# Patient Record
Sex: Female | Born: 1955 | Race: Black or African American | Hispanic: No | Marital: Married | Smoking: Former smoker
Health system: Southern US, Community
[De-identification: ages and names within clinical notes are randomized; demographics above are authoritative.]

## PROBLEM LIST (undated history)

## (undated) DIAGNOSIS — F32A Depression, unspecified: Secondary | ICD-10-CM

## (undated) DIAGNOSIS — F329 Major depressive disorder, single episode, unspecified: Secondary | ICD-10-CM

## (undated) DIAGNOSIS — F99 Mental disorder, not otherwise specified: Secondary | ICD-10-CM

## (undated) HISTORY — PX: ABDOMINAL HYSTERECTOMY: SHX81

## (undated) HISTORY — DX: Mental disorder, not otherwise specified: F99

---

## 2001-12-12 ENCOUNTER — Emergency Department (HOSPITAL_COMMUNITY): Admission: EM | Admit: 2001-12-12 | Discharge: 2001-12-12 | Payer: Self-pay | Admitting: Emergency Medicine

## 2004-01-24 ENCOUNTER — Ambulatory Visit (HOSPITAL_COMMUNITY): Admission: RE | Admit: 2004-01-24 | Discharge: 2004-01-24 | Payer: Self-pay | Admitting: Obstetrics & Gynecology

## 2007-02-10 ENCOUNTER — Ambulatory Visit (HOSPITAL_COMMUNITY): Admission: RE | Admit: 2007-02-10 | Discharge: 2007-02-10 | Payer: Self-pay | Admitting: Obstetrics and Gynecology

## 2008-02-14 ENCOUNTER — Ambulatory Visit (HOSPITAL_COMMUNITY): Admission: RE | Admit: 2008-02-14 | Discharge: 2008-02-14 | Payer: Self-pay | Admitting: Obstetrics and Gynecology

## 2008-07-05 ENCOUNTER — Ambulatory Visit (HOSPITAL_COMMUNITY): Admission: RE | Admit: 2008-07-05 | Discharge: 2008-07-05 | Payer: Self-pay | Admitting: Obstetrics and Gynecology

## 2008-07-17 ENCOUNTER — Other Ambulatory Visit: Admission: RE | Admit: 2008-07-17 | Discharge: 2008-07-17 | Payer: Self-pay | Admitting: Obstetrics and Gynecology

## 2008-08-28 ENCOUNTER — Encounter: Payer: Self-pay | Admitting: Obstetrics and Gynecology

## 2008-08-28 ENCOUNTER — Inpatient Hospital Stay (HOSPITAL_COMMUNITY): Admission: RE | Admit: 2008-08-28 | Discharge: 2008-08-30 | Payer: Self-pay | Admitting: Obstetrics and Gynecology

## 2010-07-21 LAB — COMPREHENSIVE METABOLIC PANEL
ALT: 13 U/L (ref 0–35)
AST: 16 U/L (ref 0–37)
Albumin: 3.2 g/dL — ABNORMAL LOW (ref 3.5–5.2)
Alkaline Phosphatase: 74 U/L (ref 39–117)
BUN: 7 mg/dL (ref 6–23)
CO2: 30 mEq/L (ref 19–32)
Calcium: 8.8 mg/dL (ref 8.4–10.5)
Chloride: 104 mEq/L (ref 96–112)
Creatinine, Ser: 0.66 mg/dL (ref 0.4–1.2)
GFR calc Af Amer: >60 mL/min (ref >60)
GFR calc non Af Amer: >60 mL/min (ref >60)
Glucose, Bld: 87 mg/dL (ref 70–99)
Potassium: 4.4 mEq/L (ref 3.5–5.1)
Sodium: 139 mEq/L (ref 135–145)
Total Bilirubin: 0.4 mg/dL (ref 0.3–1.2)
Total Protein: 6.1 g/dL (ref 6.0–8.3)

## 2010-07-21 LAB — CBC
HCT: 31.8 % — ABNORMAL LOW (ref 36.0–46.0)
HCT: 33.5 % — ABNORMAL LOW (ref 36.0–46.0)
HCT: 36.1 % (ref 36.0–46.0)
Hemoglobin: 11.1 g/dL — ABNORMAL LOW (ref 12.0–15.0)
Hemoglobin: 11.7 g/dL — ABNORMAL LOW (ref 12.0–15.0)
Hemoglobin: 12.6 g/dL (ref 12.0–15.0)
MCHC: 34.9 g/dL (ref 30.0–36.0)
MCHC: 34.9 g/dL (ref 30.0–36.0)
MCHC: 34.9 g/dL (ref 30.0–36.0)
MCV: 89.8 fL (ref 78.0–100.0)
MCV: 90.1 fL (ref 78.0–100.0)
MCV: 90.1 fL (ref 78.0–100.0)
Platelets: 280 10*3/uL (ref 150–400)
Platelets: 313 10*3/uL (ref 150–400)
Platelets: 358 10*3/uL (ref 150–400)
RBC: 3.55 MIL/uL — ABNORMAL LOW (ref 3.87–5.11)
RBC: 3.72 MIL/uL — ABNORMAL LOW (ref 3.87–5.11)
RBC: 4.01 MIL/uL (ref 3.87–5.11)
RDW: 14 % (ref 11.5–15.5)
RDW: 14.1 % (ref 11.5–15.5)
RDW: 14.1 % (ref 11.5–15.5)
WBC: 10.4 10*3/uL (ref 4.0–10.5)
WBC: 10.9 10*3/uL — ABNORMAL HIGH (ref 4.0–10.5)
WBC: 7.8 10*3/uL (ref 4.0–10.5)

## 2010-07-21 LAB — DIFFERENTIAL
Basophils Absolute: 0 10*3/uL (ref 0.0–0.1)
Basophils Absolute: 0.1 10*3/uL (ref 0.0–0.1)
Basophils Relative: 0 % (ref 0–1)
Basophils Relative: 1 % (ref 0–1)
Eosinophils Absolute: 0.1 10*3/uL (ref 0.0–0.7)
Eosinophils Absolute: 0.1 10*3/uL (ref 0.0–0.7)
Eosinophils Relative: 1 % (ref 0–5)
Eosinophils Relative: 1 % (ref 0–5)
Lymphocytes Relative: 16 % (ref 12–46)
Lymphocytes Relative: 25 % (ref 12–46)
Lymphs Abs: 1.8 10*3/uL (ref 0.7–4.0)
Lymphs Abs: 2.6 10*3/uL (ref 0.7–4.0)
Monocytes Absolute: 0.5 10*3/uL (ref 0.1–1.0)
Monocytes Absolute: 0.7 10*3/uL (ref 0.1–1.0)
Monocytes Relative: 5 % (ref 3–12)
Monocytes Relative: 7 % (ref 3–12)
Neutro Abs: 6.9 10*3/uL (ref 1.7–7.7)
Neutro Abs: 8.5 10*3/uL — ABNORMAL HIGH (ref 1.7–7.7)
Neutrophils Relative %: 67 % (ref 43–77)
Neutrophils Relative %: 78 % — ABNORMAL HIGH (ref 43–77)

## 2010-07-21 LAB — HCG, QUANTITATIVE, PREGNANCY: hCG, Beta Chain, Quant, S: 3 m[IU]/mL (ref <5)

## 2010-07-21 LAB — TYPE AND SCREEN
ABO/RH(D): O POS
Antibody Screen: NEGATIVE

## 2010-08-25 NOTE — Discharge Summary (Signed)
NAMECAROLEEN, STOERMER          ACCOUNT NO.:  [id number]   MEDICAL RECORD NO.:  [id number]          PATIENT TYPE:  INP   LOCATION:  A336                          FACILITY:  [facility]   PHYSICIAN:  Tilda Burrow, M.D. DATE OF BIRTH:  Aug 29, 1955   DATE OF ADMISSION:  08/28/2008  DATE OF DISCHARGE:  05/21/2010LH                               DISCHARGE SUMMARY   ADMITTING DIAGNOSIS:  Symptomatic uterine fibroids, 20-week size.   DISCHARGE DIAGNOSIS:  Symptomatic uterine fibroids, 20-week size.   PROCEDURE:  Total abdominal hysterectomy, partial panniculectomy, III  Pelosi incision.   DISCHARGE MEDICATIONS:  1. Motrin 600 mg p.o. q.6 h. p.r.n. pain.  2. Cipro 500 mg p.o. b.i.d. x7 days.  3. Vicodin 5/500 thirty tablets 1-2 q.4 h. p.r.n. severe pain.   Follow up in 4-5 days for Jackson-Pratt drain removal and incision  check.   HOSPITAL SUMMARY:  A 55 year old female admitted for abdominal  hysterectomy with excision of portion of lower abdominal fat and skin  for excess.  Uterine fibroids were 16 x 8 x 13 cm on ultrasound with  multiple fibroids identified with no other suspected pelvic  abnormalities.  Surgery was uneventful with blood loss of less than 100  mL.  Jackson-Pratt drains were left in place at the site of the old  excision of fat and skin.  Postoperatively, the patient did excellently  with postoperative hemoglobin 11, hematocrit 33 compared to 12 and 37  preop.  She had resumption of normal bowel function, toleration of diet  within 24 hours.  She was able to ambulate without difficulty, incision  looked great, and she was discharged home on postop day 2 with minimal  pain med requirements.  Pathology reports showed a 600-g uterus and an  ellipse of skin and fatty tissue.  Routine postsurgical instructions  including daily ambulation, notification of physician for fever or  swelling in the incision area.      Tilda Burrow, M.D.  Electronically Signed     JVF/MEDQ  D:  08/30/2008  T:  08/31/2008  Job:  [id number]   cc:   [facility].   Edward L. Juanetta Gosling, M.D.  Fax: [phone]

## 2010-08-25 NOTE — H&P (Signed)
Betty Maldonado, Betty Maldonado          ACCOUNT NO.:  [id number]   MEDICAL RECORD NO.:  [id number]          PATIENT TYPE:  AMB   LOCATION:  DAY                           FACILITY:  [facility]   PHYSICIAN:  Tilda Burrow, M.D. DATE OF BIRTH:  [birth year]   DATE OF ADMISSION:  DATE OF DISCHARGE:  LH                              HISTORY & PHYSICAL   ADMITTING DIAGNOSIS:  Symptomatic uterine fibroids, 20 weeks' size.   HISTORY OF PRESENT ILLNESS:  This 55 year old female is admitted this  time for abdominal hysterectomy through a Pelosi incision.  She is a  gravida 2, para 1-0-1-1, premenopausal with LMP April 2010 with a recent  Pap smear normal, GC and Chlamydia culture negative, and ultrasound  confirming a large mobile uterus 16 x 8 x 13 cm with multiple fibroids  up to 4.6 cm in size with normal ovaries bilaterally.  No  intraperitoneal fluid or other suspected pelvic abnormalities.  The  patient has been seen in our office for the discomfort associated with  large pelvic mass, which she identified herself.  She denies any  intermenstrual bleeding, postcoital bleeding, or spotting.  The  ultrasound did not identify any endometrial thickening, had 9-mm  thickness, normal for this age in premenopausal individual.  Pap smear  state is normal.  GC and Chlamydia culture negative.   PAST MEDICAL HISTORY:  Benign.  She sees Dr. Juanetta Gosling for general medical  care.   SURGICAL HISTORY:  Negative other than D and C many years ago.   HABITS:  Cigarettes, 8 cigarettes per day.  Alcohol and recreational  drugs denied.   MEDICATIONS:  Iron and multivitamin x1 month.   PHYSICAL EXAMINATION:  GENERAL:  She is a healthy-appearing, African  American female, alert, oriented x3.  HEENT:  Pupils equal, round, and reactive.  Also notable for upper  dentures, which are removable.  NECK:  Supple.  Normal thyroid.  CHEST:  Clear to auscultation.  LUNGS:  Clear.  CARDIAC:  Regular rate and rhythm.  No  murmurs, rubs, or gallops  appreciated.  ABDOMEN:  Nontender without masses other than the previously mentioned  fibroid.  There are no hernias.  External genitalia are normal.  Multiparous female.  GU:  Vaginal exam, good vaginal length.  Cervix is small, less than 3  cm.  Transverse diameter mobile.  Adnexa is without masses.  The uterus  is sufficiently mobile, that pelvic access should be good.  EXTREMITIES:  Without cyanosis, clubbing, or edema.   IMPRESSION:  Uterine fibroids, 20 weeks.   PLAN:  Abdominal hysterectomy by Pelosi incision.  The patient has been  counseled over the procedure, risks, potential complications, and  alternatives including, not proceeding at this time with any surgery,  and she understands her options and desires to proceed at this time.  She has also asked [place] to assess the ovaries and consider removal if  abnormalities are suspected.      Tilda Burrow, M.D.  Electronically Signed     JVF/MEDQ  D:  08/14/2008  T:  08/15/2008  Job:  [id number]   cc:   Ramon Dredge L. Juanetta Gosling,  M.D.  Fax: [phone]   [facility]

## 2010-08-25 NOTE — Op Note (Signed)
NAME:  Betty Maldonado, Betty Maldonado NO.:  [id number]   MEDICAL RECORD NO.:  [id number]          PATIENT TYPE:  INP   LOCATION:  A336                          FACILITY:  [facility]   PHYSICIAN:  Tilda Burrow, M.D. DATE OF BIRTH:  07/02/1955   DATE OF PROCEDURE:  DATE OF DISCHARGE:                               OPERATIVE REPORT   PREOPERATIVE DIAGNOSES:  Symptomatic uterine fibroids, 20-week size.   POSTOPERATIVE DIAGNOSES:  Symptomatic uterine fibroids, 20-week size.   PROCEDURE:  Total abdominal hysterectomy.   SURGEON:  Tilda Burrow, MD   ASSISTANT:  Excell Seltzer. Annabell Howells, M.D.   ANESTHESIA:  General, Maryan Puls, CRNA   COMPLICATIONS:  None   FINDINGS:  Large globular fibroid uterus reaching to umbilicus, easy  access to lower uterine segment.   DETAILS OF PROCEDURE:  The patient was taken to the operating room,  prepped and draped in the usual fashion for lower abdominal surgery.  Previously marked off the ellipse of skin and subcu fatty tissue was  removed from lateral to the anterosuperior iliac crest to the midline,  cut approximately 40 cm long incision by 12 cm maximum width.  The layer  of fatty tissue was left overlying the fascia throughout.  Midline lower  abdominal incision was performed with sharp dissection through the  fascia and careful blunt entry into the peritoneal cavity and sharp  opening of the peritoneum cephalad and caudad.  No suspicion of injury  was encountered.  The omentum was smooth and normal in appearance.  Uterus was irregular, lobular with multiple external, internal, and  intramural fibroids.  Round ligaments were taken down on each side.  Tubes and ovaries were inspected and the ovaries were small 2-3 cm in  length normal in gross appearance.  Tubes and ovaries were preserved as  discussed previously in preoperative counseling.  A 0-chromic was used  to ligate pedicles.  Lahey thyroid tenaculum was then used to grasp the  uterine fundus  and Balfour retractor was in place with 2 laparotomy  tapes and a surgical tape in the abdomen to hold the bowel out of the  surgical field.  The broad ligament was serially clamped, cut, and  suture ligated using 0-chromic.  The uterine vessels were cross-clamped  with curved Heaney clamp, Kelly clamp for back bleeding, transected with  scissors and 0-chromic suture ligature.  Upper cardinal ligaments were  clamped, cut, and suture ligated with 0-chromic and straight Heaney  clamps.  The uterine fundus was amputated out of the way and lower  uterine segment grasped with a tenaculum and we marched down the upper  and lower cardinal ligaments serially clamping, cutting, and suture  ligating using small bites with the straight Heaney clamp, knife  dissection and 0-chromic suture ligature.  A portion of the paracervical  ring was left in place as we cord out the cervix.  The cervix was  amputated off the cuff using Mayo scissors.  Aldridge stitches were  placed at each line of vaginal angle to attach the cuff angles to the  lower cardinal ligaments.  The remainder of the cuff was closed with  series of interrupted 0-chromic sutures with good cuff support obtained.  Hemostasis was excellent throughout the procedure.  Bladder flap was  inspected to confirm it is hemostatic.  Single stitch was placed over  the bladder flap over the cuff itself.  The pelvis was irrigated,  inspected, and confirmed as adequately hemostatic.  EBL was 100 mL or  less.   Laparotomy equipment was removed, peritoneum closed with running 2-0  chromic after which the omentum moved down beneath the peritoneal  closure.  The fascia was closed in a running, locking fashion with 0-  Prolene suture.  Subcu tissues were re-approximated with interrupted 2-0  plain with 2 flat 10 mm Jackson-Pratt drains placed from each lateral  aspect of the incision and allowed to exit through separate stab  incision just over the mons  pubis.  These were sutured in place.  Subcuticular closure of the incision was performed using subcuticular 3-  0 Vicryl.  It should be noted that the lateral one-fourth of the  incision on each side was closed earlier in the case prior to the actual  peritoneal opening.  EBL 100 ml.  Sponge and needle counts were correct.  The patient went to recovery room in good condition.      Tilda Burrow, M.D.  Electronically Signed     JVF/MEDQ  D:  08/28/2008  T:  08/29/2008  Job:  [id number]   cc:   Ramon Dredge L. Juanetta Gosling, M.D.  Fax: [phone]   [facility]

## 2015-12-18 ENCOUNTER — Other Ambulatory Visit (HOSPITAL_COMMUNITY): Payer: Self-pay | Admitting: Otolaryngology

## 2016-01-30 ENCOUNTER — Other Ambulatory Visit (HOSPITAL_COMMUNITY): Payer: Self-pay | Admitting: Otolaryngology

## 2016-01-30 ENCOUNTER — Ambulatory Visit (HOSPITAL_COMMUNITY)
Admission: RE | Admit: 2016-01-30 | Discharge: 2016-01-30 | Disposition: A | Discharge status: Home / Self Care | Payer: 59 | Source: Ambulatory Visit | Attending: Otolaryngology | Admitting: Otolaryngology

## 2016-01-30 DIAGNOSIS — R7611 Nonspecific reaction to tuberculin skin test without active tuberculosis: Secondary | ICD-10-CM | POA: Insufficient documentation

## 2017-07-05 ENCOUNTER — Encounter: Payer: Self-pay | Admitting: Family Medicine

## 2017-07-05 ENCOUNTER — Ambulatory Visit (INDEPENDENT_AMBULATORY_CARE_PROVIDER_SITE_OTHER): Payer: BLUE CROSS/BLUE SHIELD | Admitting: Family Medicine

## 2017-07-05 VITALS — BP 144/92 | Ht 65.0 in | Wt 232.0 lb

## 2017-07-05 DIAGNOSIS — M542 Cervicalgia: Secondary | ICD-10-CM | POA: Diagnosis not present

## 2017-07-05 DIAGNOSIS — M25519 Pain in unspecified shoulder: Secondary | ICD-10-CM | POA: Diagnosis not present

## 2017-07-05 MED ORDER — DICLOFENAC SODIUM 50 MG PO TBEC: 50.0000 mg | DELAYED_RELEASE_TABLET | Freq: Two times a day (BID) | ORAL | 0 refills | Status: DC

## 2017-07-05 MED ORDER — TIZANIDINE HCL 4 MG PO TABS: 4.0000 mg | ORAL_TABLET | Freq: Two times a day (BID) | ORAL | 0 refills | Status: DC | PRN
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
## 2017-07-07 ENCOUNTER — Other Ambulatory Visit: Payer: Self-pay

## 2017-07-07 ENCOUNTER — Ambulatory Visit (HOSPITAL_COMMUNITY): Payer: BLUE CROSS/BLUE SHIELD | Attending: Family Medicine | Admitting: Physical Therapy

## 2017-07-07 ENCOUNTER — Encounter (HOSPITAL_COMMUNITY): Payer: Self-pay | Admitting: Physical Therapy

## 2017-07-07 DIAGNOSIS — R29898 Other symptoms and signs involving the musculoskeletal system: Secondary | ICD-10-CM

## 2017-07-07 DIAGNOSIS — M25512 Pain in left shoulder: Secondary | ICD-10-CM | POA: Diagnosis not present

## 2017-07-07 DIAGNOSIS — M542 Cervicalgia: Secondary | ICD-10-CM | POA: Diagnosis not present

## 2017-07-07 DIAGNOSIS — M6281 Muscle weakness (generalized): Secondary | ICD-10-CM | POA: Insufficient documentation
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
## 2017-07-08 ENCOUNTER — Encounter (HOSPITAL_COMMUNITY): Payer: Self-pay | Admitting: Physical Therapy

## 2017-07-08 ENCOUNTER — Ambulatory Visit (HOSPITAL_COMMUNITY): Payer: BLUE CROSS/BLUE SHIELD | Admitting: Physical Therapy

## 2017-07-08 DIAGNOSIS — M542 Cervicalgia: Secondary | ICD-10-CM | POA: Diagnosis not present

## 2017-07-08 DIAGNOSIS — R29898 Other symptoms and signs involving the musculoskeletal system: Secondary | ICD-10-CM

## 2017-07-08 DIAGNOSIS — M6281 Muscle weakness (generalized): Secondary | ICD-10-CM

## 2017-07-08 DIAGNOSIS — M25512 Pain in left shoulder: Secondary | ICD-10-CM
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
## 2017-07-08 NOTE — Patient Instructions (Signed)
  RETRACTION / CHIN TUCK Slowly draw your head back so that your ears line up with your shoulders.  Repeat 10 Times Hold 5 Seconds Complete 1 Set Perform 1 Time(s) a Day    CERVICAL ROTATION Turn your head towards the side, then return back to looking straight ahead. 1 x 10 each side  Repeat 10 Times Hold 2 Seconds Complete 1 Set Perform 1 Time(s) a Day    CERVICAL FLEXION Tilt your head downwards, then return back to looking straight ahead. 1 x 10  Repeat 10 Times Hold 2 Seconds Complete 1 Set Perform 1 Time(s) a Day    CERVICAL EXTENSION Tilt your head upwards, then return back to looking straight ahead. 1 x 10  Repeat 1 Time Hold 2 Seconds Complete 1 Set Perform 1 Time(s) a Day    CERVICAL SIDE BEND Tilt your head towards the side, then return back to looking straight ahead. (Be sure to keep you eyes and nose pointed straight ahead the entire time) 1 x 10 to the left and the right  Repeat 1 Time Hold 2 Seconds Complete 1 Set Perform 1 Time(s) a Day

## 2017-07-11 ENCOUNTER — Ambulatory Visit (HOSPITAL_COMMUNITY): Payer: BLUE CROSS/BLUE SHIELD | Attending: Family Medicine | Admitting: Physical Therapy

## 2017-07-11 ENCOUNTER — Encounter (HOSPITAL_COMMUNITY): Payer: Self-pay | Admitting: Physical Therapy

## 2017-07-11 DIAGNOSIS — M6281 Muscle weakness (generalized): Secondary | ICD-10-CM | POA: Insufficient documentation

## 2017-07-11 DIAGNOSIS — M25512 Pain in left shoulder: Secondary | ICD-10-CM | POA: Diagnosis not present

## 2017-07-11 DIAGNOSIS — R29898 Other symptoms and signs involving the musculoskeletal system: Secondary | ICD-10-CM | POA: Insufficient documentation

## 2017-07-11 DIAGNOSIS — M542 Cervicalgia: Secondary | ICD-10-CM | POA: Diagnosis not present

## 2017-07-11 NOTE — Patient Instructions (Addendum)
Strengthening: Lateral Bend - Isometric (in Neutral)    Using light pressure from fingertips, press into right temple. Resist bending head sideways. Hold _5___ seconds. Repeat __5-10__ times per set. Do _1___ sets per session. Do ___2_ sessions per day.  http://orth.exer.us/302   Copyright  VHI. All rights reserved.  Strengthening: Extension - Isometric (in Neutral)    Using light pressure from fingertips at back of head, resist bending head backward. Hold ___5_ seconds. Repeat _5-10___ times per set. Do _1___ sets per session. Do __3__ sessions per day.  http://orth.exer.us/308   Copyright  VHI. All rights reserved.  Strengthening: Shoulder Shrug (Phase 1)    Shrug shoulders backward now relax . Repeat _10___ times per set. Do __1__ sets per session. Do ___2_ sessions per day.  http://orth.exer.us/336   Copyright  VHI. All rights reserved.
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
## 2017-07-13 ENCOUNTER — Ambulatory Visit (HOSPITAL_COMMUNITY): Payer: BLUE CROSS/BLUE SHIELD | Admitting: Physical Therapy

## 2017-07-13 ENCOUNTER — Encounter (HOSPITAL_COMMUNITY): Payer: Self-pay | Admitting: Physical Therapy

## 2017-07-13 DIAGNOSIS — R29898 Other symptoms and signs involving the musculoskeletal system: Secondary | ICD-10-CM | POA: Diagnosis not present

## 2017-07-13 DIAGNOSIS — M25512 Pain in left shoulder: Secondary | ICD-10-CM

## 2017-07-13 DIAGNOSIS — M542 Cervicalgia: Secondary | ICD-10-CM | POA: Diagnosis not present

## 2017-07-13 DIAGNOSIS — M6281 Muscle weakness (generalized): Secondary | ICD-10-CM | POA: Diagnosis not present
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
## 2017-07-15 ENCOUNTER — Encounter: Payer: Self-pay | Admitting: Family Medicine

## 2017-07-15 ENCOUNTER — Ambulatory Visit (INDEPENDENT_AMBULATORY_CARE_PROVIDER_SITE_OTHER): Payer: BLUE CROSS/BLUE SHIELD | Admitting: Family Medicine

## 2017-07-15 VITALS — BP 142/88 | Ht 65.0 in | Wt 231.0 lb

## 2017-07-15 DIAGNOSIS — F4321 Adjustment disorder with depressed mood: Secondary | ICD-10-CM | POA: Insufficient documentation

## 2017-07-15 DIAGNOSIS — Z634 Disappearance and death of family member: Secondary | ICD-10-CM

## 2017-07-15 DIAGNOSIS — F321 Major depressive disorder, single episode, moderate: Secondary | ICD-10-CM

## 2017-07-15 MED ORDER — SERTRALINE HCL 50 MG PO TABS: 50.0000 mg | ORAL_TABLET | Freq: Every day | ORAL | 3 refills | Status: DC
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
## 2017-07-18 NOTE — Addendum Note (Signed)
Addended by: Carmelina Noun on: 07/18/2017 06:15 PM   Modules accepted: Orders

## 2017-07-19 ENCOUNTER — Other Ambulatory Visit: Payer: Self-pay | Admitting: Family Medicine

## 2017-07-19 ENCOUNTER — Ambulatory Visit (HOSPITAL_COMMUNITY): Payer: BLUE CROSS/BLUE SHIELD | Admitting: Physical Therapy

## 2017-07-19 ENCOUNTER — Encounter (HOSPITAL_COMMUNITY): Payer: Self-pay | Admitting: Physical Therapy

## 2017-07-19 DIAGNOSIS — M25512 Pain in left shoulder: Secondary | ICD-10-CM

## 2017-07-19 DIAGNOSIS — M542 Cervicalgia: Secondary | ICD-10-CM

## 2017-07-19 DIAGNOSIS — M6281 Muscle weakness (generalized): Secondary | ICD-10-CM | POA: Diagnosis not present

## 2017-07-19 DIAGNOSIS — Z634 Disappearance and death of family member: Principal | ICD-10-CM

## 2017-07-19 DIAGNOSIS — F321 Major depressive disorder, single episode, moderate: Secondary | ICD-10-CM

## 2017-07-19 DIAGNOSIS — R29898 Other symptoms and signs involving the musculoskeletal system: Secondary | ICD-10-CM | POA: Diagnosis not present

## 2017-07-19 DIAGNOSIS — F4321 Adjustment disorder with depressed mood: Secondary | ICD-10-CM
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
## 2017-07-21 ENCOUNTER — Ambulatory Visit (HOSPITAL_COMMUNITY): Payer: BLUE CROSS/BLUE SHIELD | Admitting: Physical Therapy

## 2017-07-21 DIAGNOSIS — M6281 Muscle weakness (generalized): Secondary | ICD-10-CM | POA: Diagnosis not present

## 2017-07-21 DIAGNOSIS — M25512 Pain in left shoulder: Secondary | ICD-10-CM

## 2017-07-21 DIAGNOSIS — M542 Cervicalgia: Secondary | ICD-10-CM

## 2017-07-21 DIAGNOSIS — R29898 Other symptoms and signs involving the musculoskeletal system: Secondary | ICD-10-CM

## 2017-07-21 NOTE — Patient Instructions (Addendum)
Scapular Retraction (Prone)    Lie with arms at sides. Pinch shoulder blades together and raise arms a few inches from floor. Repeat _15___ times per set. Do _4___ sets per session. Do 2____ sessions per day.  http://orth.exer.us/954   Copyright  VHI. All rights reserved.  Scapular Retraction: Abduction (Prone)    Lie with upper arms straight out from sides, elbows bent to 90. Pinch shoulder blades together and raise arms a few inches from floor. Repeat __10__ times per set. Do __1__ sets per session. Do __2__ sessions per day.  http://orth.exer.us/956   Copyright  VHI. All rights reserved.  Strengthening: Wall Push-Up    With arms slightly wider apart than shoulder width, and feet _15___ inches from wall, gently lean body toward wall. Repeat _10-15___ times per set. Do __1__ sets per session. Do _2___ sessions per day.  http://orth.exer.us/904   Copyright  VHI. All rights reserved.
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
## 2017-07-26 ENCOUNTER — Ambulatory Visit (HOSPITAL_COMMUNITY): Payer: BLUE CROSS/BLUE SHIELD | Admitting: Physical Therapy

## 2017-07-26 ENCOUNTER — Encounter (HOSPITAL_COMMUNITY): Payer: Self-pay | Admitting: Physical Therapy

## 2017-07-26 DIAGNOSIS — R29898 Other symptoms and signs involving the musculoskeletal system: Secondary | ICD-10-CM

## 2017-07-26 DIAGNOSIS — M6281 Muscle weakness (generalized): Secondary | ICD-10-CM | POA: Diagnosis not present

## 2017-07-26 DIAGNOSIS — M25512 Pain in left shoulder: Secondary | ICD-10-CM | POA: Diagnosis not present

## 2017-07-26 DIAGNOSIS — M542 Cervicalgia: Secondary | ICD-10-CM | POA: Diagnosis not present
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
## 2017-07-28 ENCOUNTER — Ambulatory Visit (HOSPITAL_COMMUNITY): Payer: BLUE CROSS/BLUE SHIELD | Admitting: Physical Therapy

## 2017-07-28 ENCOUNTER — Encounter (HOSPITAL_COMMUNITY): Payer: Self-pay | Admitting: Physical Therapy

## 2017-07-28 ENCOUNTER — Encounter: Payer: Self-pay | Admitting: Family Medicine

## 2017-07-28 DIAGNOSIS — R29898 Other symptoms and signs involving the musculoskeletal system: Secondary | ICD-10-CM | POA: Diagnosis not present

## 2017-07-28 DIAGNOSIS — M6281 Muscle weakness (generalized): Secondary | ICD-10-CM

## 2017-07-28 DIAGNOSIS — M25512 Pain in left shoulder: Secondary | ICD-10-CM

## 2017-07-28 DIAGNOSIS — M542 Cervicalgia: Secondary | ICD-10-CM
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
## 2017-08-01 ENCOUNTER — Other Ambulatory Visit: Payer: Self-pay | Admitting: Family Medicine

## 2017-08-02 ENCOUNTER — Encounter (HOSPITAL_COMMUNITY): Payer: Self-pay | Admitting: Physical Therapy

## 2017-08-02 ENCOUNTER — Ambulatory Visit (HOSPITAL_COMMUNITY): Payer: BLUE CROSS/BLUE SHIELD | Admitting: Physical Therapy

## 2017-08-02 DIAGNOSIS — M6281 Muscle weakness (generalized): Secondary | ICD-10-CM | POA: Diagnosis not present

## 2017-08-02 DIAGNOSIS — M542 Cervicalgia: Secondary | ICD-10-CM | POA: Diagnosis not present

## 2017-08-02 DIAGNOSIS — M25512 Pain in left shoulder: Secondary | ICD-10-CM

## 2017-08-02 DIAGNOSIS — R29898 Other symptoms and signs involving the musculoskeletal system: Secondary | ICD-10-CM | POA: Diagnosis not present
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
## 2017-08-04 ENCOUNTER — Ambulatory Visit (HOSPITAL_COMMUNITY): Payer: BLUE CROSS/BLUE SHIELD | Admitting: Physical Therapy

## 2017-08-04 ENCOUNTER — Encounter (HOSPITAL_COMMUNITY): Payer: Self-pay | Admitting: Physical Therapy

## 2017-08-04 DIAGNOSIS — M25512 Pain in left shoulder: Secondary | ICD-10-CM | POA: Diagnosis not present

## 2017-08-04 DIAGNOSIS — M542 Cervicalgia: Secondary | ICD-10-CM | POA: Diagnosis not present

## 2017-08-04 DIAGNOSIS — M6281 Muscle weakness (generalized): Secondary | ICD-10-CM

## 2017-08-04 DIAGNOSIS — R29898 Other symptoms and signs involving the musculoskeletal system: Secondary | ICD-10-CM

## 2017-08-04 NOTE — Patient Instructions (Signed)
  WALL PUSH UPS Standing at a wall, place your arms out in front of you with your elbows [place] so that your hands just reach the wall. Next, bend your elbows slowly to bring your chest closer to the wall. Maintain your feet planted on the ground the entire time. Repeat 10 Times Hold 1 Second Complete 1 Set Perform 1 Time(s) a Day
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
## 2017-08-09 ENCOUNTER — Ambulatory Visit (HOSPITAL_COMMUNITY): Payer: BLUE CROSS/BLUE SHIELD | Admitting: Physical Therapy

## 2017-08-09 ENCOUNTER — Encounter (HOSPITAL_COMMUNITY): Payer: Self-pay | Admitting: Physical Therapy

## 2017-08-09 DIAGNOSIS — M25512 Pain in left shoulder: Secondary | ICD-10-CM

## 2017-08-09 DIAGNOSIS — M542 Cervicalgia: Secondary | ICD-10-CM | POA: Diagnosis not present

## 2017-08-09 DIAGNOSIS — R29898 Other symptoms and signs involving the musculoskeletal system: Secondary | ICD-10-CM | POA: Diagnosis not present

## 2017-08-09 DIAGNOSIS — M6281 Muscle weakness (generalized): Secondary | ICD-10-CM | POA: Diagnosis not present
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
## 2017-08-11 ENCOUNTER — Encounter (HOSPITAL_COMMUNITY): Payer: Self-pay | Admitting: Physical Therapy

## 2017-08-11 ENCOUNTER — Ambulatory Visit (HOSPITAL_COMMUNITY): Payer: BLUE CROSS/BLUE SHIELD | Attending: Family Medicine | Admitting: Physical Therapy

## 2017-08-11 DIAGNOSIS — M25512 Pain in left shoulder: Secondary | ICD-10-CM

## 2017-08-11 DIAGNOSIS — M542 Cervicalgia: Secondary | ICD-10-CM | POA: Insufficient documentation

## 2017-08-11 DIAGNOSIS — M6281 Muscle weakness (generalized): Secondary | ICD-10-CM | POA: Diagnosis not present

## 2017-08-11 DIAGNOSIS — R29898 Other symptoms and signs involving the musculoskeletal system: Secondary | ICD-10-CM | POA: Diagnosis not present
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
## 2017-08-16 ENCOUNTER — Encounter (HOSPITAL_COMMUNITY): Payer: Self-pay

## 2017-08-16 ENCOUNTER — Ambulatory Visit (HOSPITAL_COMMUNITY): Payer: BLUE CROSS/BLUE SHIELD

## 2017-08-16 DIAGNOSIS — M542 Cervicalgia: Secondary | ICD-10-CM

## 2017-08-16 DIAGNOSIS — M25512 Pain in left shoulder: Secondary | ICD-10-CM | POA: Diagnosis not present

## 2017-08-16 DIAGNOSIS — R29898 Other symptoms and signs involving the musculoskeletal system: Secondary | ICD-10-CM | POA: Diagnosis not present

## 2017-08-16 DIAGNOSIS — M6281 Muscle weakness (generalized): Secondary | ICD-10-CM | POA: Diagnosis not present

## 2017-08-16 NOTE — Patient Instructions (Signed)
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
## 2017-08-17 ENCOUNTER — Ambulatory Visit (INDEPENDENT_AMBULATORY_CARE_PROVIDER_SITE_OTHER): Payer: BLUE CROSS/BLUE SHIELD | Admitting: Family Medicine

## 2017-08-17 ENCOUNTER — Encounter: Payer: Self-pay | Admitting: Family Medicine

## 2017-08-17 VITALS — BP 134/80 | Ht 65.0 in | Wt 224.0 lb

## 2017-08-17 DIAGNOSIS — Z114 Encounter for screening for human immunodeficiency virus [HIV]: Secondary | ICD-10-CM

## 2017-08-17 DIAGNOSIS — Z79899 Other long term (current) drug therapy: Secondary | ICD-10-CM

## 2017-08-17 DIAGNOSIS — Z13220 Encounter for screening for lipoid disorders: Secondary | ICD-10-CM

## 2017-08-17 DIAGNOSIS — Z1231 Encounter for screening mammogram for malignant neoplasm of breast: Secondary | ICD-10-CM | POA: Diagnosis not present

## 2017-08-17 DIAGNOSIS — Z1211 Encounter for screening for malignant neoplasm of colon: Secondary | ICD-10-CM | POA: Diagnosis not present

## 2017-08-17 DIAGNOSIS — Z1239 Encounter for other screening for malignant neoplasm of breast: Secondary | ICD-10-CM

## 2017-08-17 DIAGNOSIS — Z1159 Encounter for screening for other viral diseases: Secondary | ICD-10-CM

## 2017-08-17 MED ORDER — SERTRALINE HCL 100 MG PO TABS: 100.0000 mg | ORAL_TABLET | Freq: Every day | ORAL | 5 refills | Status: DC
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
## 2017-08-18 ENCOUNTER — Ambulatory Visit (HOSPITAL_COMMUNITY): Payer: BLUE CROSS/BLUE SHIELD | Admitting: Physical Therapy

## 2017-08-18 ENCOUNTER — Encounter (HOSPITAL_COMMUNITY): Payer: Self-pay | Admitting: Physical Therapy

## 2017-08-18 DIAGNOSIS — M25512 Pain in left shoulder: Secondary | ICD-10-CM

## 2017-08-18 DIAGNOSIS — M542 Cervicalgia: Secondary | ICD-10-CM

## 2017-08-18 DIAGNOSIS — M6281 Muscle weakness (generalized): Secondary | ICD-10-CM

## 2017-08-18 DIAGNOSIS — R29898 Other symptoms and signs involving the musculoskeletal system: Secondary | ICD-10-CM

## 2017-08-18 NOTE — Patient Instructions (Signed)
  UPPER TRAP STRETCH - HAND ON HEAD Begin by retracting your head back into a chin tuck position. Next, move your head towards one side with the help of your hand for light over pressure. To stretch left side bring your head to the right side Repeat 3 Times Hold 30 Seconds Complete 1 Set Perform 1 Time(s) a Day
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
## 2017-08-23 ENCOUNTER — Encounter (HOSPITAL_COMMUNITY): Payer: Self-pay | Admitting: Physical Therapy

## 2017-08-23 ENCOUNTER — Ambulatory Visit (HOSPITAL_COMMUNITY): Payer: BLUE CROSS/BLUE SHIELD | Admitting: Physical Therapy

## 2017-08-23 DIAGNOSIS — M6281 Muscle weakness (generalized): Secondary | ICD-10-CM

## 2017-08-23 DIAGNOSIS — M25512 Pain in left shoulder: Secondary | ICD-10-CM | POA: Diagnosis not present

## 2017-08-23 DIAGNOSIS — R29898 Other symptoms and signs involving the musculoskeletal system: Secondary | ICD-10-CM | POA: Diagnosis not present

## 2017-08-23 DIAGNOSIS — M542 Cervicalgia: Secondary | ICD-10-CM
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
## 2017-08-24 ENCOUNTER — Ambulatory Visit (HOSPITAL_COMMUNITY)
Admission: RE | Admit: 2017-08-24 | Discharge: 2017-08-24 | Disposition: A | Discharge status: Home / Self Care | Payer: BLUE CROSS/BLUE SHIELD | Source: Ambulatory Visit | Attending: Family Medicine | Admitting: Family Medicine

## 2017-08-24 ENCOUNTER — Other Ambulatory Visit: Payer: Self-pay | Admitting: *Deleted

## 2017-08-24 ENCOUNTER — Encounter (HOSPITAL_COMMUNITY): Payer: Self-pay

## 2017-08-24 DIAGNOSIS — Z1231 Encounter for screening mammogram for malignant neoplasm of breast: Secondary | ICD-10-CM | POA: Insufficient documentation

## 2017-08-25 ENCOUNTER — Encounter (HOSPITAL_COMMUNITY): Payer: Self-pay | Admitting: Physical Therapy

## 2017-08-25 ENCOUNTER — Ambulatory Visit (HOSPITAL_COMMUNITY): Payer: BLUE CROSS/BLUE SHIELD | Admitting: Physical Therapy

## 2017-08-25 DIAGNOSIS — M25512 Pain in left shoulder: Secondary | ICD-10-CM

## 2017-08-25 DIAGNOSIS — M542 Cervicalgia: Secondary | ICD-10-CM

## 2017-08-25 DIAGNOSIS — M6281 Muscle weakness (generalized): Secondary | ICD-10-CM

## 2017-08-25 DIAGNOSIS — R29898 Other symptoms and signs involving the musculoskeletal system: Secondary | ICD-10-CM | POA: Diagnosis not present
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
## 2017-08-26 DIAGNOSIS — Z1159 Encounter for screening for other viral diseases: Secondary | ICD-10-CM | POA: Diagnosis not present

## 2017-08-26 DIAGNOSIS — Z13220 Encounter for screening for lipoid disorders: Secondary | ICD-10-CM | POA: Diagnosis not present

## 2017-08-26 DIAGNOSIS — Z79899 Other long term (current) drug therapy: Secondary | ICD-10-CM | POA: Diagnosis not present

## 2017-08-26 DIAGNOSIS — Z114 Encounter for screening for human immunodeficiency virus [HIV]: Secondary | ICD-10-CM | POA: Diagnosis not present

## 2017-08-27 LAB — LIPID PANEL
Chol/HDL Ratio: 5.2 ratio — ABNORMAL HIGH (ref 0.0–4.4)
Cholesterol, Total: 194 mg/dL (ref 100–199)
HDL: 37 mg/dL — ABNORMAL LOW (ref >39)
LDL Calculated: 134 mg/dL — ABNORMAL HIGH (ref 0–99)
Triglycerides: 113 mg/dL (ref 0–149)
VLDL Cholesterol Cal: 23 mg/dL (ref 5–40)

## 2017-08-27 LAB — BASIC METABOLIC PANEL
BUN/Creatinine Ratio: 9 (ref 9–23)
BUN: 8 mg/dL (ref 6–24)
CO2: 24 mmol/L (ref 20–29)
Calcium: 9.2 mg/dL (ref 8.7–10.2)
Chloride: 105 mmol/L (ref 96–106)
Creatinine, Ser: 0.87 mg/dL (ref 0.57–1.00)
GFR calc Af Amer: 84 mL/min/{1.73_m2} (ref >59)
GFR calc non Af Amer: 73 mL/min/{1.73_m2} (ref >59)
Glucose: 103 mg/dL — ABNORMAL HIGH (ref 65–99)
Potassium: 4.2 mmol/L (ref 3.5–5.2)
Sodium: 142 mmol/L (ref 134–144)

## 2017-08-27 LAB — HEPATITIS C ANTIBODY: Hep C Virus Ab: <0.1 s/co ratio (ref 0.0–0.9)

## 2017-08-27 LAB — HIV ANTIBODY (ROUTINE TESTING W REFLEX): HIV Screen 4th Generation wRfx: NONREACTIVE

## 2017-08-29 ENCOUNTER — Encounter (INDEPENDENT_AMBULATORY_CARE_PROVIDER_SITE_OTHER): Payer: Self-pay | Admitting: *Deleted

## 2017-08-29 ENCOUNTER — Encounter: Payer: Self-pay | Admitting: Family Medicine

## 2017-08-29 ENCOUNTER — Encounter (INDEPENDENT_AMBULATORY_CARE_PROVIDER_SITE_OTHER): Payer: Self-pay

## 2017-08-30 ENCOUNTER — Ambulatory Visit (HOSPITAL_COMMUNITY): Payer: BLUE CROSS/BLUE SHIELD | Admitting: Physical Therapy

## 2017-08-30 ENCOUNTER — Encounter (HOSPITAL_COMMUNITY): Payer: Self-pay | Admitting: Physical Therapy

## 2017-08-30 DIAGNOSIS — M542 Cervicalgia: Secondary | ICD-10-CM

## 2017-08-30 DIAGNOSIS — M25512 Pain in left shoulder: Secondary | ICD-10-CM

## 2017-08-30 DIAGNOSIS — M6281 Muscle weakness (generalized): Secondary | ICD-10-CM

## 2017-08-30 DIAGNOSIS — R29898 Other symptoms and signs involving the musculoskeletal system: Secondary | ICD-10-CM | POA: Diagnosis not present

## 2017-08-30 NOTE — Patient Instructions (Signed)
  BODY MECHANICS - OVER HEAD LIFTING Start by standing close to the object with feet spread apart. Bend at the knees and hips and NOT at your spine. Hold the object close to your body as you use your legs muscles to stand back up lifting the object. Walk over to the surface you want to set the object on and raise it up over head with a "one-hand-under and one-hand-over" technique as shown. Set it down and DO NOT extend at the spine. Also, be sure NOT to twist your spine but to pivot your feet so that your feet are pointed forward to where you want to set the object. Slide the object on the shelf to off load your body.
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
## 2017-09-04 ENCOUNTER — Other Ambulatory Visit: Payer: Self-pay | Admitting: Family Medicine
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
## 2017-10-19 ENCOUNTER — Ambulatory Visit (INDEPENDENT_AMBULATORY_CARE_PROVIDER_SITE_OTHER): Payer: BLUE CROSS/BLUE SHIELD | Admitting: Psychiatry

## 2017-10-19 ENCOUNTER — Encounter (HOSPITAL_COMMUNITY): Payer: Self-pay | Admitting: Psychiatry

## 2017-10-19 VITALS — BP 148/84 | HR 81 | Ht 65.0 in | Wt 219.0 lb

## 2017-10-19 DIAGNOSIS — F331 Major depressive disorder, recurrent, moderate: Secondary | ICD-10-CM

## 2017-10-19 MED ORDER — SERTRALINE HCL 100 MG PO TABS: 150.0000 mg | ORAL_TABLET | Freq: Every day | ORAL | 0 refills | Status: DC

## 2017-10-19 NOTE — Patient Instructions (Signed)
1. Increase sertraline 150 mg daily  2. Referral to therapy  3. Return to clinic in one month for 30 mins 

## 2017-10-20 LAB — TSH: TSH: 0.96 mIU/L (ref 0.40–4.50)
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
## 2017-11-18 ENCOUNTER — Other Ambulatory Visit (HOSPITAL_COMMUNITY): Payer: Self-pay | Admitting: Psychiatry

## 2017-11-18 MED ORDER — SERTRALINE HCL 100 MG PO TABS: 150.0000 mg | ORAL_TABLET | Freq: Every day | ORAL | 0 refills | Status: DC

## 2017-11-29 ENCOUNTER — Encounter (HOSPITAL_COMMUNITY): Payer: Self-pay | Admitting: Psychiatry

## 2017-11-29 ENCOUNTER — Ambulatory Visit (INDEPENDENT_AMBULATORY_CARE_PROVIDER_SITE_OTHER): Payer: BLUE CROSS/BLUE SHIELD | Admitting: Psychiatry

## 2017-11-29 VITALS — BP 147/85 | HR 81 | Ht 65.0 in | Wt 219.0 lb

## 2017-11-29 DIAGNOSIS — F321 Major depressive disorder, single episode, moderate: Secondary | ICD-10-CM | POA: Diagnosis not present

## 2017-11-29 MED ORDER — SERTRALINE HCL 100 MG PO TABS: 150.0000 mg | ORAL_TABLET | Freq: Every day | ORAL | 0 refills | Status: DC

## 2017-11-29 NOTE — Patient Instructions (Signed)
1. Continue sertraline 150 mg daily  2. Return to clinic in three months for 15 mins

## 2017-12-19 ENCOUNTER — Ambulatory Visit (INDEPENDENT_AMBULATORY_CARE_PROVIDER_SITE_OTHER): Payer: BLUE CROSS/BLUE SHIELD | Admitting: Family Medicine

## 2017-12-19 VITALS — BP 152/88 | Ht 65.0 in | Wt 219.2 lb

## 2017-12-19 DIAGNOSIS — F321 Major depressive disorder, single episode, moderate: Secondary | ICD-10-CM

## 2017-12-19 DIAGNOSIS — R7301 Impaired fasting glucose: Secondary | ICD-10-CM | POA: Diagnosis not present

## 2017-12-19 DIAGNOSIS — E785 Hyperlipidemia, unspecified: Secondary | ICD-10-CM | POA: Insufficient documentation

## 2017-12-19 DIAGNOSIS — Z1211 Encounter for screening for malignant neoplasm of colon: Secondary | ICD-10-CM

## 2017-12-19 DIAGNOSIS — E782 Mixed hyperlipidemia: Secondary | ICD-10-CM

## 2017-12-19 NOTE — Progress Notes (Signed)
   Subjective:    Patient ID: Betty Maldonado, female    DOB: 03-30-1956, 62 y.o.   MRN: [id number]  HPI Patient arrives for a follow up on depression. Patient is currently under the care of the psychiatrist and she increased her Zoloft and is doing well with no problems or concerns. Patient being treated for depression actually doing much better than what she was She is getting some counseling this fall and does see psychiatry Patient had lab work earlier this year that showed hyperlipidemia also showed slight elevation of glucose Patient does try to do good job watching her diet and trying to stay somewhat active She does work   Review of Systems  Constitutional: Negative for activity change, fatigue and fever.  HENT: Negative for congestion and rhinorrhea.   Respiratory: Negative for cough, chest tightness and shortness of breath.   Cardiovascular: Negative for chest pain and leg swelling.  Gastrointestinal: Negative for abdominal pain and nausea.  Skin: Negative for color change.  Neurological: Negative for dizziness and headaches.  Psychiatric/Behavioral: Negative for agitation and behavioral problems.       Objective:   Physical Exam  Constitutional: She appears well-nourished. No distress.  HENT:  Head: Normocephalic and atraumatic.  Eyes: Right eye exhibits no discharge. Left eye exhibits no discharge.  Neck: No tracheal deviation present.  Cardiovascular: Normal rate, regular rhythm and normal heart sounds.  No murmur heard. Pulmonary/Chest: Effort normal and breath sounds normal. No respiratory distress.  Musculoskeletal: She exhibits no edema.  Lymphadenopathy:    She has no cervical adenopathy.  Neurological: She is alert. Coordination normal.  Skin: Skin is warm and dry.  Psychiatric: She has a normal mood and affect. Her behavior is normal.  Vitals reviewed.         Assessment & Plan:  Depression under good treatment followed by psychiatry no need  to follow-up here  Hyperlipidemia no need for medications watch diet closely stay physically active [place] lab work in the spring  Fasting hyperglycemia talked at length about the importance of watching diet and minimizing starches  Talk with patient at length about the importance of getting colonoscopy she agrees to go to do so

## 2017-12-27 ENCOUNTER — Encounter: Payer: Self-pay | Admitting: Family Medicine

## 2018-01-05 ENCOUNTER — Telehealth (HOSPITAL_COMMUNITY): Payer: Self-pay | Admitting: *Deleted

## 2018-01-05 NOTE — Telephone Encounter (Signed)
Called Rx stated they didn't have script for Sertraline 100 mg  Written on 11/29/17. Patient was upset knowning it was sent in & rx denied. I called informed receipt of confirmation on 11/29/17 @ 8 : 22 am. Script then found & processing to fill begun. Notified patient

## 2018-01-16 ENCOUNTER — Ambulatory Visit (HOSPITAL_COMMUNITY): Payer: BLUE CROSS/BLUE SHIELD | Admitting: Psychiatry

## 2018-01-16 ENCOUNTER — Ambulatory Visit: Payer: BLUE CROSS/BLUE SHIELD | Admitting: Family Medicine

## 2018-01-24 ENCOUNTER — Ambulatory Visit (HOSPITAL_COMMUNITY): Payer: BLUE CROSS/BLUE SHIELD | Admitting: Psychiatry

## 2018-01-25 ENCOUNTER — Ambulatory Visit (INDEPENDENT_AMBULATORY_CARE_PROVIDER_SITE_OTHER): Payer: Self-pay

## 2018-01-25 DIAGNOSIS — Z1211 Encounter for screening for malignant neoplasm of colon: Secondary | ICD-10-CM

## 2018-01-25 MED ORDER — PEG 3350-KCL-NA BICARB-NACL 420 G PO SOLR: 4000.0000 mL | ORAL | 0 refills | Status: DC

## 2018-01-25 NOTE — Patient Instructions (Addendum)
Betty Maldonado   19-Aug-1955 MRN: [id number]    Procedure Date: 04/03/18 Time to register: 8:30am Place to register: [facility] Procedure Time:9:30am Scheduled provider: Barney Drain, MD  PREPARATION FOR COLONOSCOPY WITH TRI-LYTE SPLIT PREP  Please notify us immediately if you are diabetic, take iron supplements, or if you are on Coumadin or any other blood thinners.   You will need to purchase 1 fleet enema and 1 box of Bisacodyl 70m tablets.  1 DAY BEFORE PROCEDURE:  DATE: 04/02/18  DAY: Sunday  clear liquids the entire day - NO SOLID FOOD.   At 2:00 pm:  Take 2 Bisacodyl tablets.   At 4:00pm:  Start drinking your solution. Make sure you mix well per instructions on the bottle. Try to drink 1 (one) 8 ounce glass every 10-15 minutes until you have consumed HALF the jug. You should complete by 6:00pm.You must keep the left over solution refrigerated until completed next day.  Continue clear liquids. You must drink plenty of clear liquids to prevent dehyration and kidney failure.     DAY OF PROCEDURE:   DATE: 04/03/18   DAY: Monday If you take medications for your heart, blood pressure or breathing, you may take these medications.   Five hours before your procedure time @ 4:30am:  Finish remaining amout of bowel prep, drinking 1 (one) 8 ounce glass every 10-15 minutes until complete. You have two hours to consume remaining prep.   Three hours before your procedure time '@6' :30am:  Nothing by mouth.   At least one hour before going to the hospital:  Give yourself one Fleet enema. You may take your morning medications with sip of water unless we have instructed otherwise.      Please see below for Dietary Information.  CLEAR LIQUIDS INCLUDE:  Water Jello (NOT red in color)   Ice Popsicles (NOT red in color)   Tea (sugar ok, no milk/cream) Powdered fruit flavored drinks  Coffee (sugar ok, no milk/cream) Gatorade/ Lemonade/ Kool-Aid  (NOT red in color)   Juice:  apple, white grape, white cranberry Soft drinks  Clear bullion, consomme, broth (fat free beef/chicken/vegetable)  Carbonated beverages (any kind)  Strained chicken noodle soup Hard Candy   Remember: Clear liquids are liquids that will allow you to see your fingers on the other side of a clear glass. Be sure liquids are NOT red in color, and not cloudy, but CLEAR.  DO NOT EAT OR DRINK ANY OF THE FOLLOWING:  Dairy products of any kind   Cranberry juice Tomato juice / V8 juice   Grapefruit juice Orange juice     Red grape juice  Do not eat any solid foods, including such foods as: cereal, oatmeal, yogurt, fruits, vegetables, creamed soups, eggs, bread, crackers, pureed foods in a blender, etc.   HELPFUL HINTS FOR DRINKING PREP SOLUTION:   Make sure prep is extremely cold. Mix and refrigerate the the morning of the prep. You may also put in the freezer.   You may try mixing some Crystal Light or Country Time Lemonade if you prefer. Mix in small amounts; add more if necessary.  Try drinking through a straw  Rinse mouth with water or a mouthwash between glasses, to remove after-taste.  Try sipping on a cold beverage /ice/ popsicles between glasses of prep.  Place a piece of sugar-free hard candy in mouth between glasses.  If you become nauseated, try consuming smaller amounts, or stretch out the time between glasses. Stop for 30-60 minutes,  then slowly start back drinking.        OTHER INSTRUCTIONS  You will need a responsible adult at least 60 years of age to accompany you and drive you home. This person must remain in the waiting room during your procedure. The hospital will cancel your procedure if you do not have a responsible adult with you.   1. Wear loose fitting clothing that is easily removed. 2. Leave jewelry and other valuables at home.  3. Remove all body piercing jewelry and leave at home. 4. Total time from sign-in until discharge is approximately 2-3  hours. 5. You should go home directly after your procedure and rest. You can resume normal activities the day after your procedure. 6. The day of your procedure you should not:  Drive  Make legal decisions  Operate machinery  Drink alcohol  Return to work   You may call the office (Dept: [phone]) before 5:00pm, or page the doctor on call [phone]) after 5:00pm, for further instructions, if necessary.   Insurance Information YOU WILL NEED TO CHECK WITH YOUR INSURANCE COMPANY FOR THE BENEFITS OF COVERAGE YOU HAVE FOR THIS PROCEDURE.  UNFORTUNATELY, NOT ALL INSURANCE COMPANIES HAVE BENEFITS TO COVER ALL OR PART OF THESE TYPES OF PROCEDURES.  IT IS YOUR RESPONSIBILITY TO CHECK YOUR BENEFITS, HOWEVER, WE WILL BE GLAD TO ASSIST YOU WITH ANY CODES YOUR INSURANCE COMPANY MAY NEED.    PLEASE NOTE THAT MOST INSURANCE COMPANIES WILL NOT COVER A SCREENING COLONOSCOPY FOR PEOPLE UNDER THE AGE OF 50  IF YOU HAVE BC[employer] INSURANCE, YOU MAY HAVE BENEFITS FOR A SCREENING COLONOSCOPY BUT IF POLYPS ARE FOUND THE DIAGNOSIS WILL CHANGE AND THEN YOU MAY HAVE A DEDUCTIBLE THAT WILL NEED TO BE MET. SO PLEASE MAKE SURE YOU CHECK YOUR BENEFITS FOR A SCREENING COLONOSCOPY AS WELL AS A DIAGNOSTIC COLONOSCOPY.

## 2018-01-25 NOTE — Progress Notes (Addendum)
Gastroenterology Pre-Procedure Review  Request Date:01/25/18 Requesting Physician: Dr.Luking- no previous tcs  PATIENT REVIEW QUESTIONS: The patient responded to the following health history questions as indicated:    1. Diabetes Melitis: no 2. Joint replacements in the past 12 months: no 3. Major health problems in the past 3 months: no 4. Has an artificial valve or MVP: no 5. Has a defibrillator: no 6. Has been advised in past to take antibiotics in advance of a procedure like teeth cleaning: no 7. Family history of colon cancer: no  8. Alcohol Use: yes (occasionally) 9. History of sleep apnea: no  10. History of coronary artery or other vascular stents placed within the last 12 months: no 11. History of any prior anesthesia complications: no    MEDICATIONS & ALLERGIES:    Patient reports the following regarding taking any blood thinners:   Plavix? no Aspirin? no Coumadin? no Brilinta? no Xarelto? no Eliquis? no Pradaxa? no Savaysa? no Effient? no  Patient confirms/reports the following medications:  Current Outpatient Medications  Medication Sig Dispense Refill  . diclofenac (VOLTAREN) 50 MG EC tablet TAKE 1 TABLET BY MOUTH TWICE A DAY (Patient taking differently: as needed. ) 60 tablet 0  . sertraline (ZOLOFT) 100 MG tablet Take 1.5 tablets (150 mg total) by mouth daily. 135 tablet 0   No current facility-administered medications for this visit.     Patient confirms/reports the following allergies:  No Known Allergies  No orders of the defined types were placed in this encounter.   AUTHORIZATION INFORMATION Primary Insurance: [place],  [place] #: [id number] Pre-Cert / Auth required: no- [id number]   SCHEDULE INFORMATION: Procedure has been scheduled as follows:  Date: 04/03/18, Time: 9:30  Location: [facility] Dr[facility]  This Gastroenterology Pre-Precedure Review Form is being routed to the following provider(s): Neil Crouch, PA

## 2018-01-30 ENCOUNTER — Encounter: Payer: Self-pay | Admitting: Adult Health

## 2018-01-30 ENCOUNTER — Other Ambulatory Visit: Payer: Self-pay

## 2018-01-30 ENCOUNTER — Ambulatory Visit (INDEPENDENT_AMBULATORY_CARE_PROVIDER_SITE_OTHER): Payer: BLUE CROSS/BLUE SHIELD | Admitting: Adult Health

## 2018-01-30 VITALS — BP 136/83 | HR 106 | Ht 65.5 in | Wt 219.0 lb

## 2018-01-30 DIAGNOSIS — Z1211 Encounter for screening for malignant neoplasm of colon: Secondary | ICD-10-CM | POA: Diagnosis not present

## 2018-01-30 DIAGNOSIS — Z1212 Encounter for screening for malignant neoplasm of rectum: Secondary | ICD-10-CM

## 2018-01-30 DIAGNOSIS — Z01419 Encounter for gynecological examination (general) (routine) without abnormal findings: Secondary | ICD-10-CM

## 2018-01-30 LAB — HEMOCCULT GUIAC POC 1CARD (OFFICE): Fecal Occult Blood, POC: NEGATIVE

## 2018-01-30 NOTE — Progress Notes (Signed)
Patient ID: Betty Maldonado, female   DOB: 02-15-1956, 62 y.o.   MRN: [id number] History of Present Illness: Betty Maldonado is a 62 year old black female, married, sp hysterectomy for fibroids, 08/2008, in for well woman gyn exam. PCP is Sallee Lange   Current Medications, Allergies, Past Medical History, Past Surgical History, Family History and Social History were reviewed in [employer] record.     Review of Systems:   Patient denies any headaches, hearing loss, fatigue, blurred vision, shortness of breath, chest pain, abdominal pain, problems with bowel movements, urination, or intercourse. No joint pain or mood swings. +hot flashes at times.   Physical Exam:BP 136/83 (BP Location: Right Arm, Patient Position: Sitting, Cuff Size: Large)   Pulse (!) 106   Ht 5' 5.5" (1.664 m)   Wt 219 lb (99.3 kg)   BMI 35.89 kg/m  General:  Well developed, well nourished, no acute distress Skin:  Warm and dry Neck:  Midline trachea, normal thyroid, good ROM, no lymphadenopathy Lungs; Clear to auscultation bilaterally Breast:  No dominant palpable mass, retraction, or nipple discharge Cardiovascular: Regular rate and rhythm Abdomen:  Soft, non tender, no hepatosplenomegaly Pelvic:  External genitalia is normal in appearance, no lesions.  The vagina is pale pink with loss of moisture and rugae.Marland Kitchen Urethra has no lesions or masses. The cervix and uterus are absent. No adnexal masses or tenderness noted.Bladder is non tender, no masses felt. Rectal: Good sphincter tone, no polyps, or hemorrhoids felt.  Hemoccult negative. Extremities/musculoskeletal:  No swelling or varicosities noted, no clubbing or cyanosis Psych:  No mood changes, alert and cooperative,seems happy PHQ 2 score 0. Examination chaperoned by Shela Nevin RN.  Impression: 1. Encounter for well woman exam with routine gynecological exam   2. Screening for colorectal cancer       Plan: Can use luvena for  vaginal moisture if needed ,and astroglide, or coconut or olive oil with intercourse if needed.  Physical in 1 year Labs with PCP Mammogram yearly

## 2018-02-02 NOTE — Progress Notes (Signed)
Ok to schedule.

## 2018-02-06 ENCOUNTER — Ambulatory Visit (INDEPENDENT_AMBULATORY_CARE_PROVIDER_SITE_OTHER): Payer: BLUE CROSS/BLUE SHIELD | Admitting: Psychiatry

## 2018-02-06 ENCOUNTER — Encounter (HOSPITAL_COMMUNITY): Payer: Self-pay | Admitting: Psychiatry

## 2018-02-06 DIAGNOSIS — F321 Major depressive disorder, single episode, moderate: Secondary | ICD-10-CM
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
## 2018-02-22 NOTE — Progress Notes (Signed)
[facility] MD/PA/NP OP Progress Note  03/01/2018 8:58 AM Betty Maldonado  MRN:  [id number]  Chief Complaint:  Chief Complaint    Depression; Follow-up     HPI:  Patient presents for follow-up appointment for depression.  She states that she has been doing well.  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  Although she used to feel guilty when she felt happy, thinking that she cannot be happy as she lost her son, she is doing better with it. She reports good relationship with her husband, although they do not share their feelings with each other.  She tries not to talk about her son as she thinks he does not like it.  Although there were a few times she felt depressed and isolative, thinking about her son, she has more good days lately.  She has insomnia, which she attributes to night shift.  She has good energy and motivation.  She denies SI.  She denies anxiety or panic attacks.  She has good concentration.      Visit Diagnosis:    ICD-10-CM   1. 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 I have reviewed the history. No updates at this time.     Past Medical History:  Past Medical History:  Diagnosis Date  . Mental disorder    depression    Past Surgical History:  Procedure Laterality Date  . ABDOMINAL HYSTERECTOMY      Family Psychiatric History: Please see initial evaluation for full details. I have reviewed the history. No updates at this time.     Family History:  Family History  Problem Relation Age of Onset  . Heart attack Mother   . Hypertension Mother   . Hypertension Father   . Dementia Father   . Schizophrenia Other   . Dementia Paternal Grandmother   . Hypertension Brother   . Hypertension Sister     Social History:  Social History   Socioeconomic History  . Marital status: Married    Spouse name: Not on file   . Number of children: Not on file  . Years of education: Not on file  . Highest education level: Not on file  Occupational History  . Not on file  Social Needs  . Financial resource strain: Not on file  . Food insecurity:    Worry: Not on file    Inability: Not on file  . Transportation needs:    Medical: Not on file    Non-medical: Not on file  Tobacco Use  . Smoking status: Former Smoker    Packs/day: 0.50    Years: 10.00    Pack years: 5.00    Last attempt to quit: 02/06/2013    Years since quitting: 5.0  . Smokeless tobacco: Never Used  Substance and Sexual Activity  . Alcohol use: Not Currently    Comment: occ  . Drug use: Never  . Sexual activity: Yes    Birth control/protection: Surgical    Comment: hyst   Lifestyle  . Physical activity:    Days per week: Not on file    Minutes per session: Not on file  . Stress: Not on file  Relationships  . Social connections:    Talks on phone: Not on file    Gets together: Not on file    Attends religious service: Not on file  Active member of club or organization: Not on file    Attends meetings of clubs or organizations: Not on file    Relationship status: Not on file  Other Topics Concern  . Not on file  Social History Narrative  . Not on file    Allergies: No Known Allergies  Metabolic Disorder Labs: No results found for: HGBA1C, MPG No results found for: PROLACTIN Lab Results  Component Value Date   CHOL 194 08/26/2017   TRIG 113 08/26/2017   HDL 37 (L) 08/26/2017   CHOLHDL 5.2 (H) 08/26/2017   LDLCALC 134 (H) 08/26/2017   Lab Results  Component Value Date   TSH 0.96 10/19/2017    Therapeutic Level Labs: No results found for: LITHIUM No results found for: VALPROATE No components found for:  CBMZ  Current Medications: Current Outpatient Medications  Medication Sig Dispense Refill  . diclofenac (VOLTAREN) 50 MG EC tablet TAKE 1 TABLET BY MOUTH TWICE A DAY (Patient taking differently: as  needed. ) 60 tablet 0  . polyethylene glycol-electrolytes (TRILYTE) 420 g solution Take 4,000 mLs by mouth as directed. 4000 mL 0  . sertraline (ZOLOFT) 100 MG tablet Take 1.5 tablets (150 mg total) by mouth daily. 135 tablet 1   No current facility-administered medications for this visit.      Musculoskeletal: Strength & Muscle Tone: within normal limits Gait & Station: normal Patient leans: N/A  Psychiatric Specialty Exam: Review of Systems  Psychiatric/Behavioral: Positive for depression. Negative for hallucinations, memory loss, substance abuse and suicidal ideas. The patient has insomnia. The patient is not nervous/anxious.   All other systems reviewed and are negative.   Blood pressure 130/84, pulse 86, height 5' 5.5" (1.664 m), weight 220 lb (99.8 kg), SpO2 98 %.Body mass index is 36.05 kg/m.  General Appearance: Fairly Groomed  Eye Contact:  Good  Speech:  Clear and Coherent  Volume:  Normal  Mood:  "good"  Affect:  Appropriate and Congruent  Thought Process:  Coherent  Orientation:  Full (Time, Place, and Person)  Thought Content: Logical   Suicidal Thoughts:  No  Homicidal Thoughts:  No  Memory:  Immediate;   Good  Judgement:  Good  Insight:  Good  Psychomotor Activity:  Normal  Concentration:  Concentration: Good and Attention Span: Good  Recall:  Good  Fund of Knowledge: Good  Language: Good  Akathisia:  No  Handed:  Right  AIMS (if indicated): not done  Assets:  Communication Skills Desire for Improvement  ADL's:  Intact  Cognition: WNL  Sleep:  Poor night shift   Screenings: PHQ2-9     Office Visit from 01/30/2018 in [facility] Office Visit from 08/17/2017 in [place] Visit from 07/15/2017 in [facility]  PHQ-2 Total Score  0  3  6  PHQ-9 Total Score  -  12  17       Assessment and Plan:  Betty Maldonado is a 62 y.o. year old female with a history of depression, S/pa hysterectomy, who presents for  follow up appointment for MDD (major depressive disorder), recurrent, in partial remission ([place])  # MDD in partial remission, recurrent She denies any significant mood symptoms since the last appointment.  Psychosocial stressors including grief of loss of her son by murder in 2016.  Will continue sertraline to target depression.  Noted that sertraline was last uptitrated in July, and she had prior episode of depression in 2010 when she found her husband's infidelity. Will plan  to taper down medication at the next visit if she denies significant mood episode.  Provided psychoeducation about importance of adherence to medication.  Validated her grief.   Plan I have reviewed and updated plans as below 1. Continue sertraline 150 mg daily  2. Return to clinic in four months for 15 mins   The patient demonstrates the following risk factors for suicide: Chronic risk factors for suicide include:psychiatric disorder ofdepression. Acute risk factorsfor suicide include: N/A. Protective factorsfor this patient include: positive social support, coping skills and hope for the future. Considering these factors, the overall suicide risk at this point appears to below. Patientisappropriate for outpatient follow up.   Norman Clay, MD 03/01/2018, 8:58 AM

## 2018-02-23 ENCOUNTER — Ambulatory Visit (INDEPENDENT_AMBULATORY_CARE_PROVIDER_SITE_OTHER): Payer: BLUE CROSS/BLUE SHIELD | Admitting: Psychiatry

## 2018-02-23 ENCOUNTER — Encounter (HOSPITAL_COMMUNITY): Payer: Self-pay | Admitting: Psychiatry

## 2018-02-23 DIAGNOSIS — F321 Major depressive disorder, single episode, moderate: Secondary | ICD-10-CM | POA: Diagnosis not present
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
## 2018-03-01 ENCOUNTER — Ambulatory Visit (INDEPENDENT_AMBULATORY_CARE_PROVIDER_SITE_OTHER): Payer: BLUE CROSS/BLUE SHIELD | Admitting: Psychiatry

## 2018-03-01 ENCOUNTER — Encounter (HOSPITAL_COMMUNITY): Payer: Self-pay | Admitting: Psychiatry

## 2018-03-01 VITALS — BP 130/84 | HR 86 | Ht 65.5 in | Wt 220.0 lb

## 2018-03-01 DIAGNOSIS — F3341 Major depressive disorder, recurrent, in partial remission: Secondary | ICD-10-CM | POA: Diagnosis not present

## 2018-03-01 MED ORDER — SERTRALINE HCL 100 MG PO TABS: 150.0000 mg | ORAL_TABLET | Freq: Every day | ORAL | 1 refills | Status: DC

## 2018-03-01 NOTE — Patient Instructions (Signed)
1. Continue sertraline 150 mg daily  2. Return to clinic in four months for 15 mins

## 2018-03-30 ENCOUNTER — Ambulatory Visit (INDEPENDENT_AMBULATORY_CARE_PROVIDER_SITE_OTHER): Payer: BLUE CROSS/BLUE SHIELD | Admitting: Psychiatry

## 2018-03-30 ENCOUNTER — Encounter

## 2018-03-30 DIAGNOSIS — F3341 Major depressive disorder, recurrent, in partial remission: Secondary | ICD-10-CM

## 2018-03-30 NOTE — Progress Notes (Signed)
   THERAPIST PROGRESS NOTE  Session Time: Thursday 03/30/2018 8:20 AM - 9:15 AM  Participation Level: Active  Behavioral Response: CasualAlertDepressed  Type of Therapy:  Individual Therapy  Treatment Goals addressed: have healthy grieving process  Interventions: Supportive, grief therapy  Summary: Betty Maldonado is a 62 y.o. female who is referred for services by psychiatrist Dr. Modesta Messing due to patient experiencing symptoms of depression. She denies any psychiatric hospitalizations. She attended one therapy session in 2000 at [facility] due to marital issues. She took paxil briefly in 2000 as prescribed by PCP. She recently began seeing Dr. Modesta Messing. Patient reports her 49 yo son was murdered in 2016 on Christmas Eve. She states having a hard time handling it, She reports feeling really sad, empty inside, and not wanting to do anything.   Patient reports continued grief and loss issues since last session but coping well. She reports her 26 yo nephew died on 03/05/2018. This triggered increased memories of her son. She reports managing Thanksgiving well and spending time with family. Her son's widow visited her on Thanksgiving and they talked briefly about patient's son and the legal case. There still is no more information about the case. Patient states accepting more son is gone and there is nothing she can do about it. She reports this has been helpful. However, she continues to try to avoid the pain associated with her loss. She reports experiencing waves of grief and trying to distract self with art on her phone. Patient reports she and husband rarely talk about their son. Patient reports reading handouts on stages of grief and says it was helpful.   Suicidal/Homicidal: Nowithout intent/plan  Therapist Response: reviewed symptoms, praised patient's efforts to read handouts, provided psychoeducation on grief process ( acute grief, integrated grief, and complicated grief), began to discussed  stages of grief, assigned patient to complete handout on her experience with each stage of grief and bring to next session, discussed the how avoidance may affect grieving process, assisted patient began to identify how avoidance has affected her life, assisted patient identify begin to identify her feelings associated with loss, assisted patient identify way to cope with upcoming Christmas holiday (acknowledgement, memorialization)  Plan: Return again in 2 weeks.  Diagnosis: Axis I: MDD, Single Episode, Moderate    Axis II: No diagnosis    BYNUM,PEGGY, LCSW 03/30/2018

## 2018-04-03 ENCOUNTER — Encounter (HOSPITAL_COMMUNITY)
Admission: RE | Disposition: A | Discharge status: Home / Self Care | Payer: Self-pay | Source: Home / Self Care | Attending: Gastroenterology

## 2018-04-03 ENCOUNTER — Ambulatory Visit (HOSPITAL_COMMUNITY)
Admission: RE | Admit: 2018-04-03 | Discharge: 2018-04-03 | Disposition: A | Discharge status: Home / Self Care | Payer: BLUE CROSS/BLUE SHIELD | Attending: Gastroenterology | Admitting: Gastroenterology

## 2018-04-03 ENCOUNTER — Encounter (HOSPITAL_COMMUNITY): Payer: Self-pay | Admitting: *Deleted

## 2018-04-03 ENCOUNTER — Other Ambulatory Visit: Payer: Self-pay

## 2018-04-03 DIAGNOSIS — D128 Benign neoplasm of rectum: Secondary | ICD-10-CM | POA: Diagnosis not present

## 2018-04-03 DIAGNOSIS — D122 Benign neoplasm of ascending colon: Secondary | ICD-10-CM | POA: Diagnosis not present

## 2018-04-03 DIAGNOSIS — Z79899 Other long term (current) drug therapy: Secondary | ICD-10-CM | POA: Insufficient documentation

## 2018-04-03 DIAGNOSIS — D123 Benign neoplasm of transverse colon: Secondary | ICD-10-CM | POA: Insufficient documentation

## 2018-04-03 DIAGNOSIS — Z8249 Family history of ischemic heart disease and other diseases of the circulatory system: Secondary | ICD-10-CM | POA: Diagnosis not present

## 2018-04-03 DIAGNOSIS — Z87891 Personal history of nicotine dependence: Secondary | ICD-10-CM | POA: Insufficient documentation

## 2018-04-03 DIAGNOSIS — Z1211 Encounter for screening for malignant neoplasm of colon: Secondary | ICD-10-CM | POA: Diagnosis not present

## 2018-04-03 DIAGNOSIS — K648 Other hemorrhoids: Secondary | ICD-10-CM | POA: Insufficient documentation

## 2018-04-03 DIAGNOSIS — F329 Major depressive disorder, single episode, unspecified: Secondary | ICD-10-CM | POA: Insufficient documentation

## 2018-04-03 DIAGNOSIS — K621 Rectal polyp: Secondary | ICD-10-CM

## 2018-04-03 HISTORY — PX: POLYPECTOMY: SHX5525

## 2018-04-03 HISTORY — DX: Major depressive disorder, single episode, unspecified: F32.9

## 2018-04-03 HISTORY — DX: Depression, unspecified: F32.A

## 2018-04-03 HISTORY — PX: COLONOSCOPY: SHX5424

## 2018-04-03 SURGERY — COLONOSCOPY
Anesthesia: Moderate Sedation

## 2018-04-03 MED ORDER — MEPERIDINE HCL 100 MG/ML IJ SOLN
INTRAMUSCULAR | Status: DC | PRN
  Administered 2018-04-03: 25 mg
  Administered 2018-04-03: 50 mg
  Administered 2018-04-03: 25 mg

## 2018-04-03 MED ORDER — STERILE WATER FOR IRRIGATION IR SOLN
Status: DC | PRN
  Administered 2018-04-03: 1.5 mL

## 2018-04-03 MED ORDER — MIDAZOLAM HCL 5 MG/5ML IJ SOLN
INTRAMUSCULAR | Status: DC | PRN
  Administered 2018-04-03 (×3): 2 mg via INTRAVENOUS

## 2018-04-03 MED ORDER — SODIUM CHLORIDE 0.9 % IV SOLN
INTRAVENOUS | Status: DC
  Administered 2018-04-03: 09:00:00 via INTRAVENOUS

## 2018-04-03 MED ORDER — MEPERIDINE HCL 100 MG/ML IJ SOLN
INTRAMUSCULAR | Status: AC
  Filled 2018-04-03: qty 2

## 2018-04-03 MED ORDER — MIDAZOLAM HCL 5 MG/5ML IJ SOLN
INTRAMUSCULAR | Status: AC
  Filled 2018-04-03: qty 10

## 2018-04-03 NOTE — H&P (Signed)
Primary Care Physician:  Kathyrn Drown, MD Primary Gastroenterologist:  Dr. Oneida Alar  Pre-Procedure History & Physical: HPI:  Betty Maldonado is a 62 y.o. female here for [place].  Past Medical History:  Diagnosis Date  . Depression   . Mental disorder    depression    Past Surgical History:  Procedure Laterality Date  . ABDOMINAL HYSTERECTOMY      Prior to Admission medications   Medication Sig Start Date End Date Taking? Authorizing Provider  sertraline (ZOLOFT) 100 MG tablet Take 1.5 tablets (150 mg total) by mouth daily. 03/01/18  Yes Norman Clay, MD    Allergies as of 01/25/2018  . (No Known Allergies)    Family History  Problem Relation Age of Onset  . Heart attack Mother   . Hypertension Mother   . Hypertension Father   . Dementia Father   . Schizophrenia Other   . Dementia Paternal Grandmother   . Hypertension Brother   . Hypertension Sister   . Colon cancer Neg Hx   . Colon polyps Neg Hx     Social History   Socioeconomic History  . Marital status: Married    Spouse name: Not on file  . Number of children: Not on file  . Years of education: Not on file  . Highest education level: Not on file  Occupational History  . Not on file  Social Needs  . Financial resource strain: Not on file  . Food insecurity:    Worry: Not on file    Inability: Not on file  . Transportation needs:    Medical: Not on file    Non-medical: Not on file  Tobacco Use  . Smoking status: Former Smoker    Packs/day: 0.50    Years: 10.00    Pack years: 5.00    Last attempt to quit: 02/06/2013    Years since quitting: 5.1  . Smokeless tobacco: Never Used  Substance and Sexual Activity  . Alcohol use: Not Currently    Comment: occ  . Drug use: Never  . Sexual activity: Yes    Birth control/protection: Surgical    Comment: hyst   Lifestyle  . Physical activity:    Days per week: Not on file    Minutes per session: Not on file  . Stress: Not on  file  Relationships  . Social connections:    Talks on phone: Not on file    Gets together: Not on file    Attends religious service: Not on file    Active member of club or organization: Not on file    Attends meetings of clubs or organizations: Not on file    Relationship status: Not on file  . Intimate partner violence:    Fear of current or ex partner: Not on file    Emotionally abused: Not on file    Physically abused: Not on file    Forced sexual activity: Not on file  Other Topics Concern  . 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 HOBBIES: TRAVEL, WATCHING MOVIES.    Review of Systems: See HPI, otherwise negative ROS   Physical Exam: BP 136/87   Pulse 79   Temp 98.3 F (36.8 C) (Oral)   Resp 18   Ht 5\' 5"  (1.651 m)   Wt 99.8 kg   SpO2 99%   BMI 36.61 kg/m  General:   Alert,  pleasant and cooperative  in NAD Head:  Normocephalic and atraumatic. Neck:  Supple; Lungs:  Clear throughout to auscultation.    Heart:  Regular rate and rhythm. Abdomen:  Soft, nontender and nondistended. Normal bowel sounds, without guarding, and without rebound.   Neurologic:  Alert and  oriented x4;  grossly normal neurologically.  Impression/Plan:    SCREENING  Plan:  1. TCS TODAY DISCUSSED PROCEDURE, BENEFITS, & RISKS: < 1% chance of medication reaction, bleeding, perforation, or rupture of spleen/liver.

## 2018-04-03 NOTE — Op Note (Signed)
[facility] Patient Name: Betty Maldonado Procedure Date: 04/03/2018 9:29 AM MRN: [id number] Date of Birth: Jul 21, 1955 Attending MD: Barney Drain MD, MD CSN: [id number] Age: 62 Admit Type: Outpatient Procedure:                Colonoscopy WITH COLD SNARE/SNARE CAUTERY                            POLYPECTOMY Indications:              Screening for colorectal malignant neoplasm Providers:                Barney Drain MD, MD, Gerome Sam, RN, Tammy                            Vaught, RN, Nelma Rothman, Technician Referring MD:             Elayne Snare. Luking Medicines:                Meperidine 100 mg IV, Midazolam 6 mg IV Complications:            No immediate complications. Estimated Blood Loss:     Estimated blood loss was minimal. Procedure:                Pre-Anesthesia Assessment:                           - Prior to the procedure, a History and Physical                            was performed, and patient medications and                            allergies were reviewed. The patient's tolerance of                            previous anesthesia was also reviewed. The risks                            and benefits of the procedure and the sedation                            options and risks were discussed with the patient.                            All questions were answered, and informed consent                            was obtained. Prior Anticoagulants: The patient has                            taken no previous anticoagulant or antiplatelet                            agents. ASA Grade Assessment: II - A patient with  mild systemic disease. After reviewing the risks                            and benefits, the patient was deemed in                            satisfactory condition to undergo the procedure.                            After obtaining informed consent, the colonoscope                            was passed under direct vision.  Throughout the                            procedure, the patient's blood pressure, pulse, and                            oxygen saturations were monitored continuously. The                            [id number]-[id number] ([id number]) scope was introduced through                            the anus and advanced to the the cecum, identified                            by appendiceal orifice and ileocecal valve. The                            colonoscopy was somewhat difficult due to a                            tortuous colon. Successful completion of the                            procedure was aided by straightening and shortening                            the scope to obtain bowel loop reduction and                            COLOWRAP. The patient tolerated the procedure well.                            The quality of the bowel preparation was good. The                            ileocecal valve, appendiceal orifice, and rectum                            were photographed. Scope In: 9:49:53 AM Scope Out: 10:17:28 AM Scope Withdrawal Time: 0 hours 24 minutes 35 seconds  Total Procedure  Duration: 0 hours 27 minutes 35 seconds  Findings:      Three sessile polyps were found in the hepatic flexure and ascending       colon(2). The polyps were 3 to 5 mm in size. These polyps were removed       with a cold snare. Resection and retrieval were complete.      A 8 mm polyp was found in the rectum. The polyp was sessile. The polyp       was removed with a hot snare. Resection and retrieval were complete VIA       RETRIEVAL NET.      Internal hemorrhoids were found. The hemorrhoids were small. Impression:               - Three 3 to 5 mm polyps at the hepatic flexure and                            in the ascending colon, removed with a cold snare.                            Resected and retrieved.                           - One 8 mm polyp in the rectum, removed with a hot                            snare.  Resected and retrieved.                           - Internal hemorrhoids. Moderate Sedation:      Moderate (conscious) sedation was administered by the endoscopy nurse       and supervised by the endoscopist. The following parameters were       monitored: oxygen saturation, heart rate, blood pressure, and response       to care. Total physician intraservice time was 41 minutes. Recommendation:           - Patient has a contact number available for                            emergencies. The signs and symptoms of potential                            delayed complications were discussed with the                            patient. Return to normal activities tomorrow.                            Written discharge instructions were provided to the                            patient.                           - High fiber diet. LOSE WEIFGHT TO BMI < 30.                           -  Continue present medications.                           - Await pathology results.                           - Repeat colonoscopy in 3 years for surveillance. Procedure Code(s):        --- Professional ---                           [phone], Colonoscopy, flexible; with removal of                            tumor(s), polyp(s), or other lesion(s) by snare                            technique                           99153, Moderate sedation; each additional 15                            minutes intraservice time                           99153, Moderate sedation; each additional 15                            minutes intraservice time                           G0500, Moderate sedation services provided by the                            same physician or other qualified health care                            professional performing a gastrointestinal                            endoscopic service that sedation supports,                            requiring the presence of an independent trained                             observer to assist in the monitoring of the                            patient's level of consciousness and physiological                            status; initial 15 minutes of intra-service time;                            patient age 22 years or older (additional time 42  be reported with [phone], as appropriate) Diagnosis Code(s):        --- Professional ---                           Z12.11, Encounter for screening for malignant                            neoplasm of colon                           D12.3, Benign neoplasm of transverse colon (hepatic                            flexure or splenic flexure)                           D12.2, Benign neoplasm of ascending colon                           K62.1, Rectal polyp                           K64.8, Other hemorrhoids CPT copyright 2018 American Medical Association. All rights reserved. The codes documented in this report are preliminary and upon coder review may  be revised to meet current compliance requirements. Barney Drain, MD Barney Drain MD, MD 04/03/2018 11:38:07 AM This report has been signed electronically. Number of Addenda: 0

## 2018-04-03 NOTE — Discharge Instructions (Signed)
You have small internal hemorrhoids and MILD diverticulosis IN YOUR LEFT COLON. YOU HAD THREE SMALL AND ONE LARGE POLYP REMOVED.    DRINK WATER TO KEEP YOUR URINE LIGHT YELLOW.  CONTINUE YOUR WEIGHT LOSS EFFORTS. YOUR BODY MASS INDEX IS OVER 30 WHICH MEANS YOU ARE OBESE. OBESITY IS ASSOCIATED WITH AN INCREASED FOR CIRRHOSIS AND ALL CANCERS, INCLUDING ESOPHAGEAL AND COLON CANCER. A WEIGHT OF 175 LBS OR LESS  WILL GET YOUR BODY MASS INDEX(BMI) UNDER 30.  FOLLOW A HIGH FIBER DIET. AVOID ITEMS THAT CAUSE BLOATING. See info below.  YOUR BIOPSY RESULTS WILL BE BACK IN 5 BUSINESS DAYS.   USE PREPARATION H FOUR TIMES  A DAY IF NEEDED TO RELIEVE RECTAL PAIN/PRESSURE/BLEEDING.  Next colonoscopy in 3 years.  Colonoscopy Care After Read the instructions outlined below and refer to this sheet in the next week. These discharge instructions provide you with general information on caring for yourself after you leave the hospital. While your treatment has been planned according to the most current medical practices available, unavoidable complications occasionally occur. If you have any problems or questions after discharge, call DR. Cassundra Mckeever, [phone].  ACTIVITY  You may resume your regular activity, but move at a slower pace for the next 24 hours.   Take frequent rest periods for the next 24 hours.   Walking will help get rid of the air and reduce the bloated feeling in your belly (abdomen).   No driving for 24 hours (because of the medicine (anesthesia) used during the test).   You may shower.   Do not sign any important legal documents or operate any machinery for 24 hours (because of the anesthesia used during the test).    NUTRITION  Drink plenty of fluids.   You may resume your normal diet as instructed by your doctor.   Begin with a light meal and progress to your normal diet. Heavy or fried foods are harder to digest and may make you feel sick to your stomach (nauseated).   Avoid  alcoholic beverages for 24 hours or as instructed.    MEDICATIONS  You may resume your normal medications.   WHAT YOU CAN EXPECT TODAY  Some feelings of bloating in the abdomen.   Passage of more gas than usual.   Spotting of blood in your stool or on the toilet paper  .  IF YOU HAD POLYPS REMOVED DURING THE COLONOSCOPY:  Eat a soft diet IF YOU HAVE NAUSEA, BLOATING, ABDOMINAL PAIN, OR VOMITING.    FINDING OUT THE RESULTS OF YOUR TEST Not all test results are available during your visit. DR. Oneida Alar WILL CALL YOU WITHIN 14 DAYS OF YOUR PROCEDUE WITH YOUR RESULTS. Do not assume everything is normal if you have not heard from DR. Arlanda Shiplett, CALL HER OFFICE AT [phone].  SEEK IMMEDIATE MEDICAL ATTENTION AND CALL THE OFFICE: [phone] IF:  You have more than a spotting of blood in your stool.   Your belly is swollen (abdominal distention).   You are nauseated or vomiting.   You have a temperature over 101F.   You have abdominal pain or discomfort that is severe or gets worse throughout the day.  High-Fiber Diet A high-fiber diet changes your normal diet to include more whole grains, legumes, fruits, and vegetables. Changes in the diet involve replacing refined carbohydrates with unrefined foods. The calorie level of the diet is essentially unchanged. The Dietary Reference Intake (recommended amount) for adult males is 38 grams per day. For adult females, it is  25 grams per day. Pregnant and lactating women should consume 28 grams of fiber per day. Fiber is the intact part of a plant that is not broken down during digestion. Functional fiber is fiber that has been isolated from the plant to provide a beneficial effect in the body.  PURPOSE  Increase stool bulk.   Ease and regulate bowel movements.   Lower cholesterol.   REDUCE RISK OF COLON CANCER  INDICATIONS THAT YOU NEED MORE FIBER  Constipation and hemorrhoids.   Uncomplicated diverticulosis (intestine  condition) and irritable bowel syndrome.   Weight management.   As a protective measure against hardening of the arteries (atherosclerosis), diabetes, and cancer.   GUIDELINES FOR INCREASING FIBER IN THE DIET  Start adding fiber to the diet slowly. A gradual increase of about 5 more grams (2 slices of whole-wheat bread, 2 servings of most fruits or vegetables, or 1 bowl of high-fiber cereal) per day is best. Too rapid an increase in fiber may result in constipation, flatulence, and bloating.   Drink enough water and fluids to keep your urine clear or pale yellow. Water, juice, or caffeine-free drinks are recommended. Not drinking enough fluid may cause constipation.   Eat a variety of high-fiber foods rather than one type of fiber.   Try to increase your intake of fiber through using high-fiber foods rather than fiber pills or supplements that contain small amounts of fiber.   The goal is to change the types of food eaten. Do not supplement your present diet with high-fiber foods, but replace foods in your present diet.   INCLUDE A VARIETY OF FIBER SOURCES  Replace refined and processed grains with whole grains, canned fruits with fresh fruits, and incorporate other fiber sources. White rice, white breads, and most bakery goods contain little or no fiber.   Brown whole-grain rice, buckwheat oats, and many fruits and vegetables are all good sources of fiber. These include: broccoli, Brussels sprouts, cabbage, cauliflower, beets, sweet potatoes, white potatoes (skin on), carrots, tomatoes, eggplant, squash, berries, fresh fruits, and dried fruits.   Cereals appear to be the richest source of fiber. Cereal fiber is found in whole grains and bran. Bran is the fiber-rich outer coat of cereal grain, which is largely removed in refining. In whole-grain cereals, the bran remains. In breakfast cereals, the largest amount of fiber is found in those with "bran" in their names. The fiber content is  sometimes indicated on the label.   You may need to include additional fruits and vegetables each day.   In baking, for 1 cup white flour, you may use the following substitutions:   1 cup whole-wheat flour minus 2 tablespoons.   1/2 cup white flour plus 1/2 cup whole-wheat flour.   Polyps, Colon  A polyp is extra tissue that grows inside your body. Colon polyps grow in the large intestine. The large intestine, also called the colon, is part of your digestive system. It is a long, hollow tube at the end of your digestive tract where your body makes and stores stool. Most polyps are not dangerous. They are benign. This means they are not cancerous. But over time, some types of polyps can turn into cancer. Polyps that are smaller than a pea are usually not harmful. But larger polyps could someday become or may already be cancerous. To be safe, doctors remove all polyps and test them.   PREVENTION There is not one sure way to prevent polyps. You might be able to lower your  risk of getting them if you:  Eat more fruits and vegetables and less fatty food.   Do not smoke.   Avoid alcohol.   Exercise every day.   Lose weight if you are overweight.   Eating more calcium and folate can also lower your risk of getting polyps. Some foods that are rich in calcium are milk, cheese, and broccoli. Some foods that are rich in folate are chickpeas, kidney beans, and spinach.    Diverticulosis Diverticulosis is a common condition that develops when small pouches (diverticula) form in the wall of the colon. The risk of diverticulosis increases with age. It happens more often in people who eat a low-fiber diet. Most individuals with diverticulosis have no symptoms. Those individuals with symptoms usually experience belly (abdominal) pain, constipation, or loose stools (diarrhea).  HOME CARE INSTRUCTIONS  Increase the amount of fiber in your diet as directed by your caregiver or dietician. This may  reduce symptoms of diverticulosis.   Drink at least 6 to 8 glasses of water each day to prevent constipation.   Try not to strain when you have a bowel movement.   Avoiding nuts and seeds to prevent complications is NOT NECESSARY.   FOODS HAVING HIGH FIBER CONTENT INCLUDE:  Fruits. Apple, peach, pear, tangerine, raisins, prunes.   Vegetables. Brussels sprouts, asparagus, broccoli, cabbage, carrot, cauliflower, romaine lettuce, spinach, summer squash, tomato, winter squash, zucchini.   Starchy Vegetables. Baked beans, kidney beans, lima beans, split peas, lentils, potatoes (with skin).   Grains. Whole wheat bread, brown rice, bran flake cereal, plain oatmeal, white rice, shredded wheat, bran muffins.   SEEK IMMEDIATE MEDICAL CARE IF:  You develop increasing pain or severe bloating.   You have an oral temperature above 101F.   You develop vomiting or bowel movements that are bloody or black.

## 2018-04-10 ENCOUNTER — Encounter (HOSPITAL_COMMUNITY): Payer: Self-pay | Admitting: Gastroenterology

## 2018-04-10 NOTE — Progress Notes (Signed)
PT is aware.

## 2018-04-10 NOTE — Progress Notes (Signed)
CC'D TO PCP AND ON RECALL  °

## 2018-04-20 ENCOUNTER — Ambulatory Visit (HOSPITAL_COMMUNITY): Payer: BLUE CROSS/BLUE SHIELD | Admitting: Psychiatry

## 2018-05-09 ENCOUNTER — Ambulatory Visit (HOSPITAL_COMMUNITY): Payer: BLUE CROSS/BLUE SHIELD | Admitting: Psychiatry

## 2018-05-23 ENCOUNTER — Ambulatory Visit (HOSPITAL_COMMUNITY): Payer: BLUE CROSS/BLUE SHIELD | Admitting: Psychiatry

## 2018-06-26 NOTE — Progress Notes (Signed)
BH MD/PA/NP OP Progress Note  06/30/2018 8:20 AM KORRINE SICARD  MRN:  [id number]  Chief Complaint:  Chief Complaint    Follow-up; Depression     HPI:  Patient presents for follow-up appointment for depression.  She states that she has been doing well except her weight gain (and smiles).  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  She struggles with insomnia at times as she used to be working at nighttime.  She is interested in trying melatonin.  She is willing to do more regular exercise.  She has good energy and motivation.  She denies difficulty in concentration.  She denies SI.  She occasionally feels minimal anxiety.  She has never had any panic attacks.  She denies SI.    Wt Readings from Last 3 Encounters:  06/30/18 240 lb (108.9 kg)  04/03/18 220 lb (99.8 kg)  03/01/18 220 lb (99.8 kg)    Visit Diagnosis:    ICD-10-CM   1. MDD (major depressive disorder), recurrent, in full remission ([place]) F33.42     Past Psychiatric History: Please see initial evaluation for full details. I have reviewed the history. No updates at this time.     Past Medical History:  Past Medical History:  Diagnosis Date  . Depression   . Mental disorder    depression    Past Surgical History:  Procedure Laterality Date  . ABDOMINAL HYSTERECTOMY    . COLONOSCOPY N/A 04/03/2018   Procedure: COLONOSCOPY;  Surgeon: Danie Binder, MD;  [facility];  Service: Endoscopy;  Laterality: N/A;  8:30  . POLYPECTOMY  04/03/2018   Procedure: POLYPECTOMY;  Surgeon: Danie Binder, MD;  Location: [facility];  Service: Endoscopy;;  Ascending,Hep.Flex,rectum    Family Psychiatric History: Please see initial evaluation for full details. I have reviewed the history. No updates at this time.     Family History:  Family History  Problem Relation Age of Onset  . Heart attack Mother   . Hypertension Mother   . Hypertension Father   . Dementia  Father   . Schizophrenia Other   . Dementia Paternal Grandmother   . Hypertension Brother   . Hypertension Sister   . Colon cancer Neg Hx   . Colon polyps Neg Hx     Social History:  Social History   Socioeconomic History  . Marital status: Married    Spouse name: Not on file  . Number of children: Not on file  . Years of education: Not on file  . Highest education level: Not on file  Occupational History  . Not on file  Social Needs  . Financial resource strain: Not on file  . Food insecurity:    Worry: Not on file    Inability: Not on file  . Transportation needs:    Medical: Not on file    Non-medical: Not on file  Tobacco Use  . Smoking status: Former Smoker    Packs/day: 0.50    Years: 10.00    Pack years: 5.00    Last attempt to quit: 02/06/2013    Years since quitting: 5.3  . Smokeless tobacco: Never Used  Substance and Sexual Activity  . Alcohol use: Not Currently    Comment: occ  . Drug use: Never  . Sexual activity: Yes    Birth control/protection: Surgical    Comment: hyst   Lifestyle  . Physical activity:    Days per  week: Not on file    Minutes per session: Not on file  . Stress: Not on file  Relationships  . Social connections:    Talks on phone: Not on file    Gets together: Not on file    Attends religious service: Not on file    Active member of club or organization: Not on file    Attends meetings of clubs or organizations: Not on file    Relationship status: Not on file  Other Topics Concern  . 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 HOBBIES: TRAVEL, WATCHING MOVIES.    Allergies: No Known Allergies  Metabolic Disorder Labs: No results found for: HGBA1C, MPG No results found for: PROLACTIN Lab Results  Component Value Date   CHOL 194 08/26/2017   TRIG 113 08/26/2017   HDL 37 (L) 08/26/2017   CHOLHDL 5.2 (H) 08/26/2017   LDLCALC 134 (H) 08/26/2017   Lab Results   Component Value Date   TSH 0.96 10/19/2017    Therapeutic Level Labs: No results found for: LITHIUM No results found for: VALPROATE No components found for:  CBMZ  Current Medications: Current Outpatient Medications  Medication Sig Dispense Refill  . sertraline (ZOLOFT) 100 MG tablet Take 1.5 tablets (150 mg total) by mouth daily. 135 tablet 1  . [START ON 09/29/2018] sertraline (ZOLOFT) 25 MG tablet Take 1 tablet (25 mg total) by mouth daily. 30 tablet 0   No current facility-administered medications for this visit.      Musculoskeletal: Strength & Muscle Tone: within normal limits Gait & Station: normal Patient leans: N/A  Psychiatric Specialty Exam: Review of Systems  Psychiatric/Behavioral: Negative for depression, hallucinations, memory loss, substance abuse and suicidal ideas. The patient is nervous/anxious and has insomnia.   All other systems reviewed and are negative.   Blood pressure 136/80, pulse 78, height 5\' 5"  (1.651 m), weight 240 lb (108.9 kg), SpO2 95 %.Body mass index is 39.94 kg/m.  General Appearance: Fairly Groomed  Eye Contact:  Good  Speech:  Clear and Coherent  Volume:  Normal  Mood:  "good"  Affect:  Appropriate, Congruent and Full Range  Thought Process:  Coherent  Orientation:  Full (Time, Place, and Person)  Thought Content: Logical   Suicidal Thoughts:  No  Homicidal Thoughts:  No  Memory:  Immediate;   Good  Judgement:  Good  Insight:  Good  Psychomotor Activity:  Normal  Concentration:  Concentration: Good and Attention Span: Good  Recall:  Good  Fund of Knowledge: Good  Language: Good  Akathisia:  No  Handed:  Right  AIMS (if indicated): not done  Assets:  Communication Skills Desire for Improvement  ADL's:  Intact  Cognition: WNL  Sleep:  Fair   Screenings: PHQ2-9     Office Visit from 01/30/2018 in [place] Office Visit from 08/17/2017 in [place] Visit from 07/15/2017 in [place]  PHQ-2 Total Score  0  3  6  PHQ-9 Total Score  -  12  17       Assessment and Plan:  TOMIE SPIZZIRRI is a 63 y.o. year old female with a history of depression, s/p hysterectomy, who presents for follow up appointment for MDD (major depressive disorder), recurrent, in full remission ([place])  # MDD in remission, (recurrent) She denies significant mood symptoms since the last appointment.  Psychosocial stressors include grief of loss of her  son by murder in 2016.  Will taper down sertraline given she has been treated at the current dose since last July (she had another episode of depression in 2010 when she found her husband's infidelity).  Provided psycho education of relapse in her symptoms.  She is advised to contact the office if she notices any worsening in her mood symptoms.  Discussed behavioral activation.   Plan 1. Decrease sertraline 100 mg daily for one month, then 50 mg daily for one month, then 25 mg daily for one month, then discontinue (she is instructed to contact the office for refill of 25 mg) 2. Return to clinic as needed   The patient demonstrates the following risk factors for suicide: Chronic risk factors for suicide include:psychiatric disorder ofdepression. Acute risk factorsfor suicide include: N/A. Protective factorsfor this patient include: positive social support, coping skills and hope for the future. Considering these factors, the overall suicide risk at this point appears to below. Patientisappropriate for outpatient follow up.  Norman Clay, MD 06/30/2018, 8:20 AM

## 2018-06-30 ENCOUNTER — Ambulatory Visit (INDEPENDENT_AMBULATORY_CARE_PROVIDER_SITE_OTHER): Payer: BLUE CROSS/BLUE SHIELD | Admitting: Psychiatry

## 2018-06-30 ENCOUNTER — Encounter (HOSPITAL_COMMUNITY): Payer: Self-pay | Admitting: Psychiatry

## 2018-06-30 ENCOUNTER — Other Ambulatory Visit: Payer: Self-pay

## 2018-06-30 VITALS — BP 136/80 | HR 78 | Ht 65.0 in | Wt 240.0 lb

## 2018-06-30 DIAGNOSIS — G4709 Other insomnia: Secondary | ICD-10-CM

## 2018-06-30 DIAGNOSIS — F3342 Major depressive disorder, recurrent, in full remission: Secondary | ICD-10-CM | POA: Insufficient documentation

## 2018-06-30 DIAGNOSIS — R450 Nervousness: Secondary | ICD-10-CM | POA: Diagnosis not present

## 2018-06-30 MED ORDER — SERTRALINE HCL 25 MG PO TABS: 25.0000 mg | ORAL_TABLET | Freq: Every day | ORAL | 0 refills | Status: DC

## 2018-06-30 NOTE — Patient Instructions (Signed)
1. Decrease sertraline 100 mg daily for one month, then 50 mg daily for one month, then 25 mg daily for one month, then discontinue 2. Return to clinic as needed

## 2018-08-14 ENCOUNTER — Ambulatory Visit: Payer: BLUE CROSS/BLUE SHIELD | Admitting: Family Medicine

## 2018-10-04 ENCOUNTER — Other Ambulatory Visit (HOSPITAL_COMMUNITY): Payer: Self-pay | Admitting: Family Medicine

## 2018-10-04 DIAGNOSIS — Z1231 Encounter for screening mammogram for malignant neoplasm of breast: Secondary | ICD-10-CM

## 2019-06-23 ENCOUNTER — Ambulatory Visit: Payer: BC Managed Care – PPO | Attending: Internal Medicine

## 2019-06-23 DIAGNOSIS — Z23 Encounter for immunization: Secondary | ICD-10-CM

## 2019-06-23 NOTE — Progress Notes (Signed)
   Covid-19 Vaccination Clinic  Name:  Betty Maldonado    MRN: [id number] DOB: [birth year]  06/23/2019  [name] was observed post Covid-19 immunization for 15 minutes without incident. She was provided with Vaccine Information Sheet and instruction to access the V-Safe system.   [name] was instructed to call 911 with any severe reactions post vaccine: Marland Kitchen Difficulty breathing  . Swelling of face and throat  . A fast heartbeat  . A bad rash all over body  . Dizziness and weakness   Immunizations Administered    Name Date Dose VIS Date Route   Moderna COVID-19 Vaccine 06/23/2019 10:26 AM 0.5 mL 03/13/2019 Intramuscular   Manufacturer: Moderna   Lot: [id number]   [id number]

## 2019-07-25 ENCOUNTER — Ambulatory Visit: Payer: BC Managed Care – PPO | Attending: Internal Medicine

## 2019-07-25 DIAGNOSIS — Z23 Encounter for immunization: Secondary | ICD-10-CM

## 2019-07-25 NOTE — Progress Notes (Signed)
   Covid-19 Vaccination Clinic  Name:  Betty Maldonado    MRN: [id number] DOB: 1956-03-14  07/25/2019  [name] was observed post Covid-19 immunization for 15 minutes without incident. She was provided with Vaccine Information Sheet and instruction to access the V-Safe system.   [name] was instructed to call 911 with any severe reactions post vaccine: Marland Kitchen Difficulty breathing  . Swelling of face and throat  . A fast heartbeat  . A bad rash all over body  . Dizziness and weakness   Immunizations Administered    Name Date Dose VIS Date Route   Moderna COVID-19 Vaccine 07/25/2019  9:29 AM 0.5 mL 03/13/2019 Intramuscular   Manufacturer: Moderna   Lot: [id number]   [place]:[id number]

## 2020-01-21 ENCOUNTER — Other Ambulatory Visit (HOSPITAL_COMMUNITY): Payer: Self-pay | Admitting: Family Medicine

## 2020-01-21 DIAGNOSIS — Z1231 Encounter for screening mammogram for malignant neoplasm of breast: Secondary | ICD-10-CM

## 2020-01-30 ENCOUNTER — Other Ambulatory Visit: Payer: Self-pay

## 2020-01-30 ENCOUNTER — Ambulatory Visit (HOSPITAL_COMMUNITY)
Admission: RE | Admit: 2020-01-30 | Discharge: 2020-01-30 | Disposition: A | Discharge status: Home / Self Care | Payer: BC Managed Care – PPO | Source: Ambulatory Visit | Attending: Family Medicine | Admitting: Family Medicine

## 2020-01-30 DIAGNOSIS — Z1231 Encounter for screening mammogram for malignant neoplasm of breast: Secondary | ICD-10-CM | POA: Insufficient documentation

## 2020-04-15 ENCOUNTER — Ambulatory Visit (INDEPENDENT_AMBULATORY_CARE_PROVIDER_SITE_OTHER): Payer: BC Managed Care – PPO | Admitting: Family Medicine

## 2020-04-15 ENCOUNTER — Other Ambulatory Visit: Payer: Self-pay

## 2020-04-15 DIAGNOSIS — F4321 Adjustment disorder with depressed mood: Secondary | ICD-10-CM | POA: Diagnosis not present

## 2020-04-15 DIAGNOSIS — Z634 Disappearance and death of family member: Secondary | ICD-10-CM | POA: Diagnosis not present

## 2020-04-15 DIAGNOSIS — J069 Acute upper respiratory infection, unspecified: Secondary | ICD-10-CM | POA: Diagnosis not present

## 2020-04-15 NOTE — Progress Notes (Signed)
   Subjective:    Patient ID: Betty Maldonado, female    DOB: Jun 02, 1955, 65 y.o.   MRN: [id number]  HPI  Patient presents today with respiratory illness Number of days present-5 days   Symptoms include-head congestion, cough, headache off and on  Presence of worrisome signs (severe shortness of breath, lethargy, etc.) -none  Recent/current visit to urgent care or ER-no  Recent direct exposure to Covid-not aware of  Any current Covid testing-none Patient relates a lot of head congestion sinus pressure not feeling good denies body aches wheezing difficulty breathing having some intermittent coughing  Review of Systems  Constitutional: Negative for activity change and fever.  HENT: Positive for congestion and rhinorrhea. Negative for ear pain.   Eyes: Negative for discharge.  Respiratory: Positive for cough. Negative for shortness of breath and wheezing.   Cardiovascular: Negative for chest pain.       Objective:   Physical Exam Vitals and nursing note reviewed.  Constitutional:      Appearance: She is well-developed.  HENT:     Head: Normocephalic.     Nose: Nose normal.     Mouth/Throat:     Mouth: Oropharynx is clear and moist.     Pharynx: No oropharyngeal exudate.  Cardiovascular:     Rate and Rhythm: Normal rate.     Heart sounds: Normal heart sounds. No murmur heard.   Pulmonary:     Effort: Pulmonary effort is normal.     Breath sounds: Normal breath sounds. No wheezing.  Musculoskeletal:     Cervical back: Neck supple.  Lymphadenopathy:     Cervical: No cervical adenopathy.  Skin:    General: Skin is warm and dry.           Assessment & Plan:  Viral process Supportive measures discussed Possible Covid Warning signs were discussed in detail Covid test taken await results Follow-up if ongoing troubles

## 2020-04-17 LAB — NOVEL CORONAVIRUS, NAA: SARS-CoV-2, NAA: NOT DETECTED

## 2020-04-17 LAB — SARS-COV-2, NAA 2 DAY TAT

## 2020-04-17 LAB — SPECIMEN STATUS REPORT

## 2021-03-17 IMAGING — MG DIGITAL SCREENING BILAT W/ TOMO W/ CAD
8 series · 8 of 24 positions shown · non-contrast
Comparison: Previous exam(s).

CLINICAL DATA: Screening.

EXAM:
DIGITAL SCREENING BILATERAL MAMMOGRAM WITH TOMO AND CAD

[L CC synth-2D]
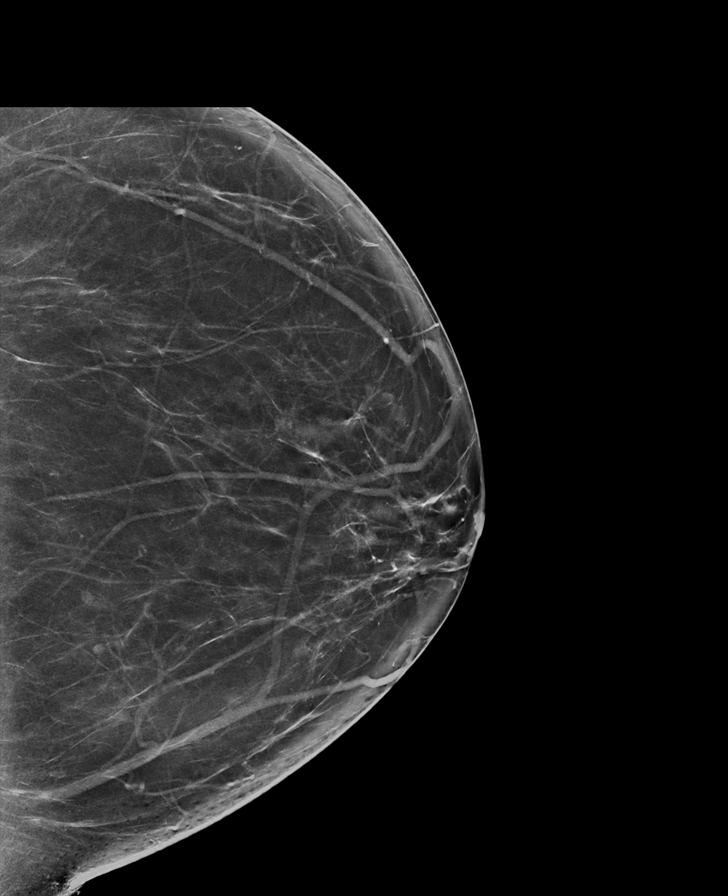

[R MLO synth-2D]
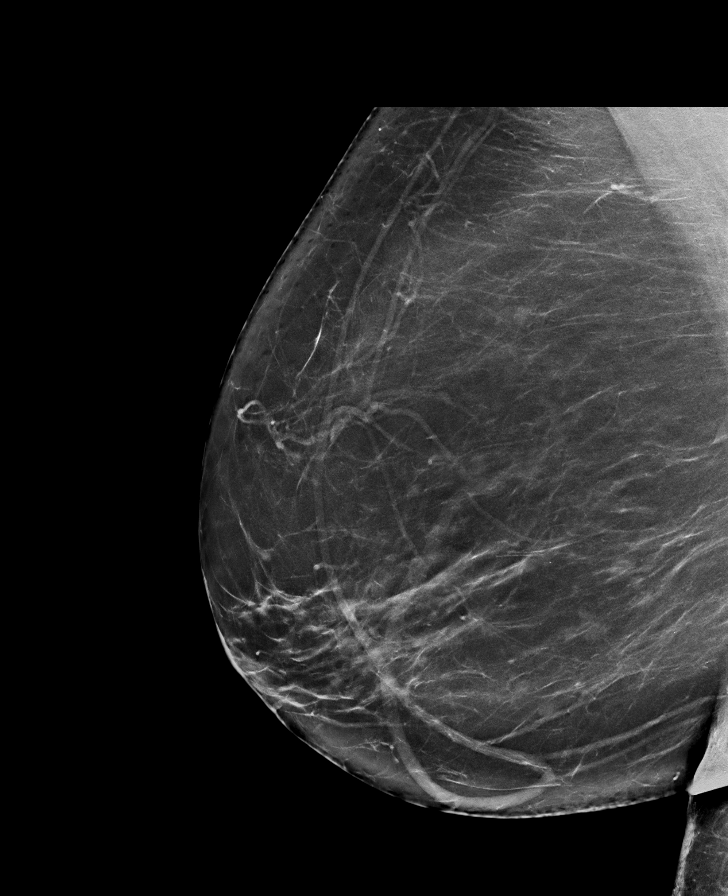

[L MLO synth-2D]
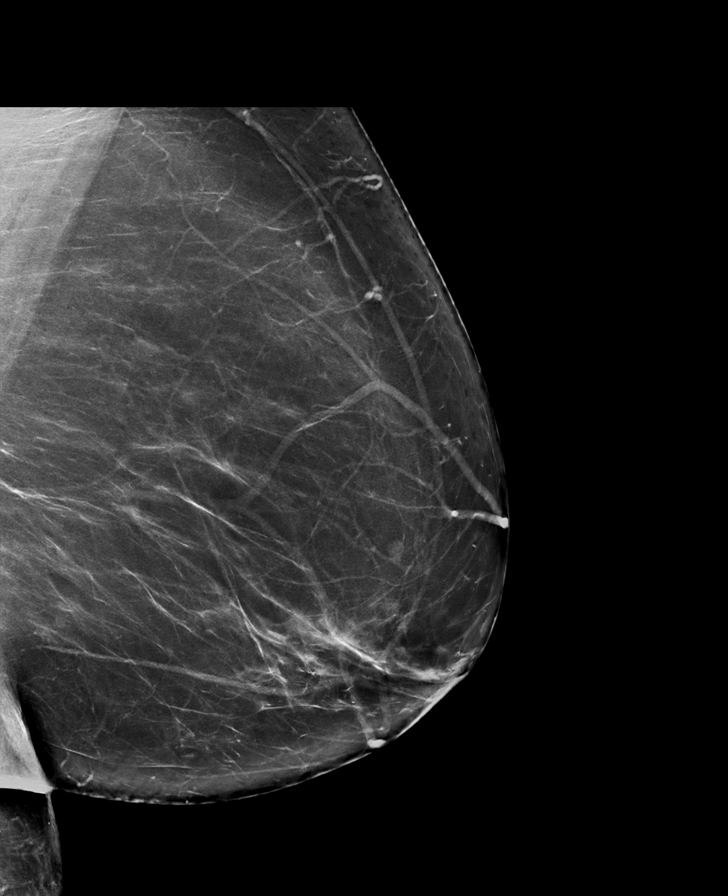

[R CC synth-2D]
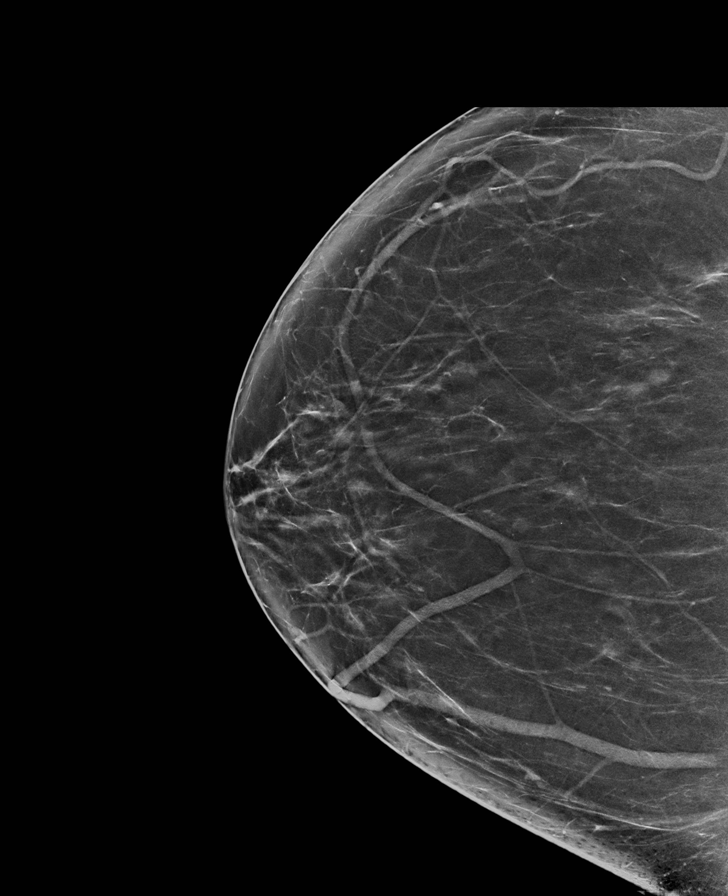

[L MLO tomo · tomo slice 47/92.0]
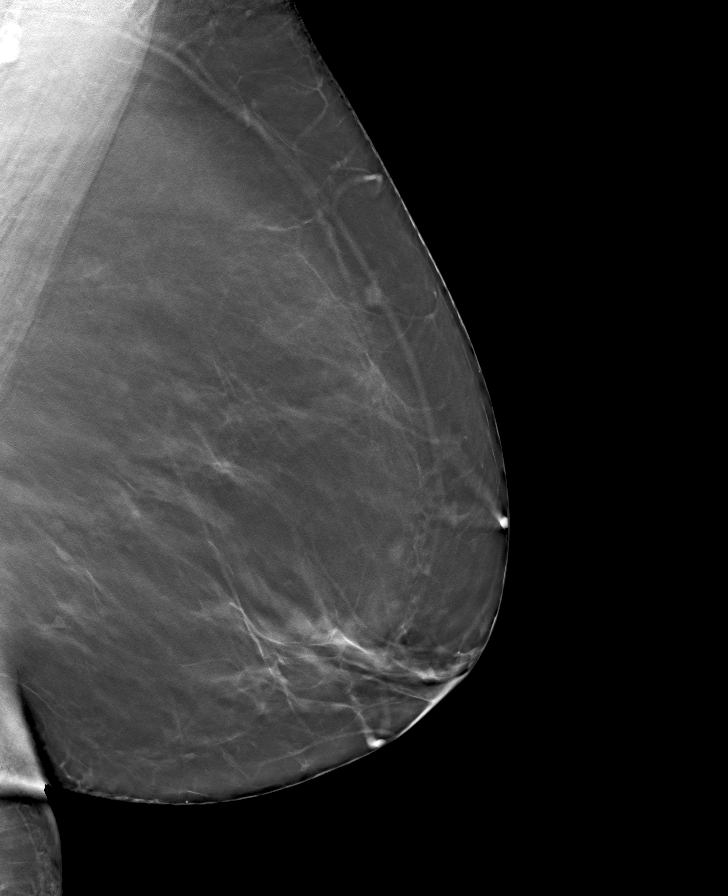

[R MLO tomo · tomo slice 47/92.0]
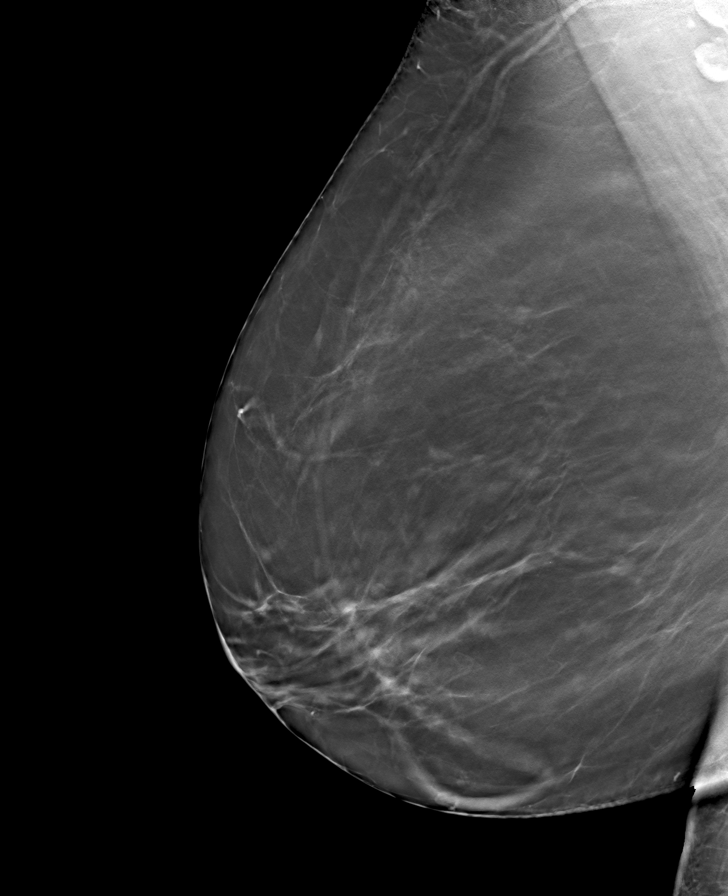

[R CC tomo · tomo slice 42/83.0]
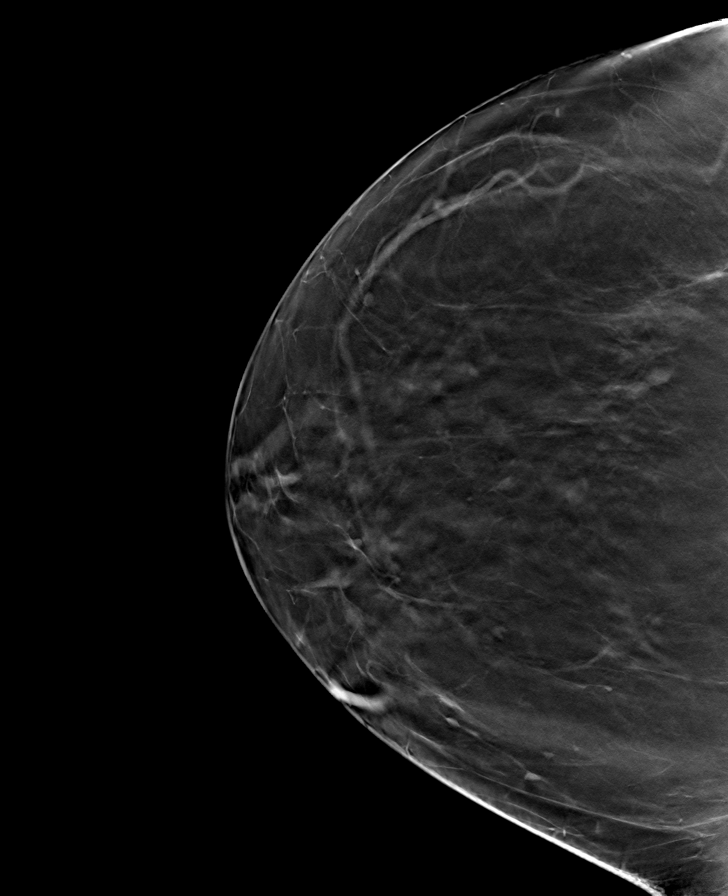

[L CC tomo · tomo slice 43/86.0]
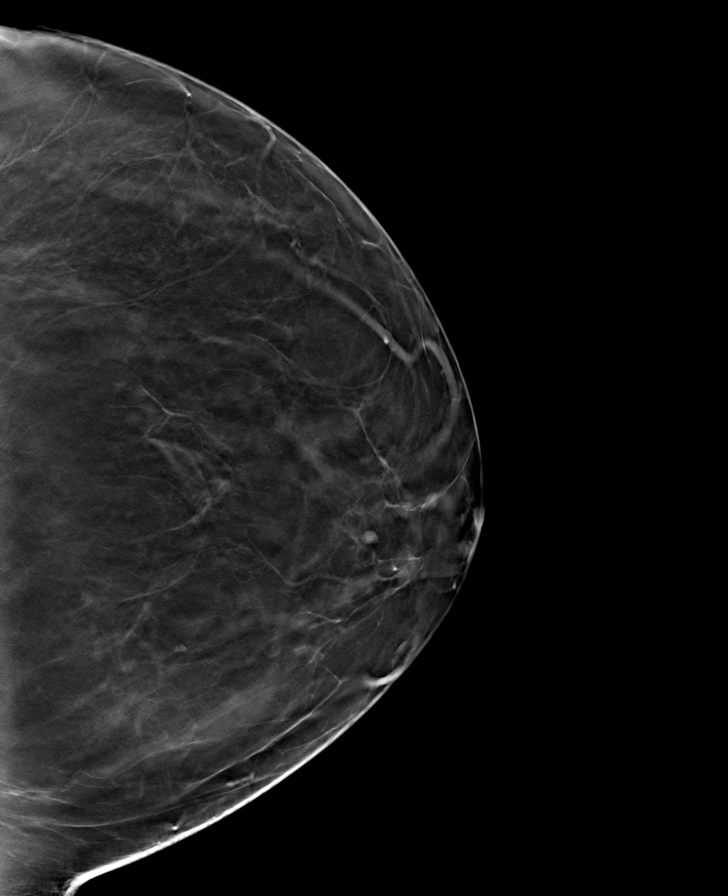

[8 of 24 positions shown; findings below may reference images not displayed]

ACR Breast Density Category b: There are scattered areas of
fibroglandular density.
FINDINGS: There are no findings suspicious for malignancy. Images were
processed with CAD.
IMPRESSION: No mammographic evidence of malignancy. A result letter of this
screening mammogram will be mailed directly to the patient.

RECOMMENDATION:
Screening mammogram in one year. (Code:[id number])

BI-RADS CATEGORY  1: Negative.

## 2021-03-30 ENCOUNTER — Encounter: Payer: Self-pay | Admitting: *Deleted

## 2021-04-04 ENCOUNTER — Other Ambulatory Visit: Payer: Self-pay

## 2021-04-04 ENCOUNTER — Encounter: Payer: Self-pay | Admitting: Emergency Medicine

## 2021-04-04 ENCOUNTER — Ambulatory Visit
Admission: EM | Admit: 2021-04-04 | Discharge: 2021-04-04 | Disposition: A | Discharge status: Home / Self Care | Payer: BC Managed Care – PPO | Attending: Physician Assistant | Admitting: Physician Assistant

## 2021-04-04 DIAGNOSIS — J069 Acute upper respiratory infection, unspecified: Secondary | ICD-10-CM

## 2021-04-04 DIAGNOSIS — Z9189 Other specified personal risk factors, not elsewhere classified: Secondary | ICD-10-CM

## 2021-04-04 MED ORDER — PROMETHAZINE-DM 6.25-15 MG/5ML PO SYRP: 5.0000 mL | ORAL_SOLUTION | Freq: Four times a day (QID) | ORAL | 0 refills | Status: DC | PRN

## 2021-04-04 NOTE — Discharge Instructions (Addendum)
Take cough syrup as needed for cough Recommend Flonase and Mucinex Drink plenty of fluids Return if symptoms become worse

## 2021-04-04 NOTE — ED Provider Notes (Signed)
[facility]    CSN: [id number] Arrival date & time: 04/04/21  5093      History   Chief Complaint No chief complaint on file.   HPI Betty Maldonado is a 65 y.o. female.   Pt complains of cough, congestion, and sneezing that started 5 days ago.  Denies fever, shortness of breath, n/v/d.  She has taken Nyquil with temporary relief.  Cough is worse at night.     Past Medical History:  Diagnosis Date   Depression    Mental disorder    depression    Patient Active Problem List   Diagnosis Date Noted   MDD (major depressive disorder), recurrent, in full remission ([place]) 06/30/2018   Special screening for malignant neoplasms, colon    Hyperlipidemia 12/19/2017   Fasting hyperglycemia 12/19/2017   Grief at loss of child 07/15/2017    Past Surgical History:  Procedure Laterality Date   ABDOMINAL HYSTERECTOMY     COLONOSCOPY N/A 04/03/2018   Procedure: COLONOSCOPY;  Surgeon: Danie Binder, MD;  [facility];  Service: Endoscopy;  Laterality: N/A;  8:30   POLYPECTOMY  04/03/2018   Procedure: POLYPECTOMY;  Surgeon: Danie Binder, MD;  [facility];  Service: Endoscopy;;  Ascending,Hep.Flex,rectum    OB History     Gravida  1   Para  1   Term      Preterm      AB      Living  0      SAB      IAB      Ectopic      Multiple      Live Births               Home Medications    Prior to Admission medications   Medication Sig Start Date End Date Taking? Authorizing Provider  promethazine-dextromethorphan (PROMETHAZINE-DM) 6.25-15 MG/5ML syrup Take 5 mLs by mouth 4 (four) times daily as needed for cough. 04/04/21  Yes Ward, Lenise Arena, PA-C    Family History Family History  Problem Relation Age of Onset   Heart attack Mother    Hypertension Mother    Hypertension Father    Dementia Father    Schizophrenia Other    Dementia Paternal Grandmother    Hypertension Brother    Hypertension Sister    Colon  cancer Neg Hx    Colon polyps Neg Hx     Social History Social History   Tobacco Use   Smoking status: Former    Packs/day: 0.50    Years: 10.00    Pack years: 5.00    Types: Cigarettes    Quit date: 02/06/2013    Years since quitting: 8.1   Smokeless tobacco: Never  Vaping Use   Vaping Use: Never used  Substance Use Topics   Alcohol use: Not Currently    Comment: occ   Drug use: Never     Allergies   Patient has no known allergies.   Review of Systems Review of Systems  Constitutional:  Negative for chills and fever.  HENT:  Positive for congestion. Negative for ear pain and sore throat.   Eyes:  Negative for pain and visual disturbance.  Respiratory:  Positive for cough. Negative for shortness of breath.   Cardiovascular:  Negative for chest pain and palpitations.  Gastrointestinal:  Negative for abdominal pain, diarrhea, nausea and vomiting.  Genitourinary:  Negative for dysuria and hematuria.  Musculoskeletal:  Positive for myalgias. Negative  for arthralgias and back pain.  Skin:  Negative for color change and rash.  Neurological:  Negative for seizures and syncope.  All other systems reviewed and are negative.   Physical Exam Triage Vital Signs ED Triage Vitals  Enc Vitals Group     BP 04/04/21 1125 (!) 139/100     Pulse Rate 04/04/21 1125 84     Resp 04/04/21 1125 18     Temp 04/04/21 1125 98.7 F (37.1 C)     Temp Source 04/04/21 1125 Oral     SpO2 04/04/21 1125 97 %     Weight --      Height --      Head Circumference --      Peak Flow --      Pain Score 04/04/21 1126 0     Pain Loc --      Pain Edu? --      Excl. in [place]? --    No data found.  Updated Vital Signs BP (!) 139/100 (BP Location: Right Arm)    Pulse 84    Temp 98.7 F (37.1 C) (Oral)    Resp 18    SpO2 97%   Visual Acuity Right Eye Distance:   Left Eye Distance:   Bilateral Distance:    Right Eye Near:   Left Eye Near:    Bilateral Near:     Physical Exam Vitals and  nursing note reviewed.  Constitutional:      General: She is not in acute distress.    Appearance: She is well-developed.  HENT:     Head: Normocephalic and atraumatic.  Eyes:     Conjunctiva/sclera: Conjunctivae normal.  Cardiovascular:     Rate and Rhythm: Normal rate and regular rhythm.     Heart sounds: No murmur heard. Pulmonary:     Effort: Pulmonary effort is normal. No respiratory distress.     Breath sounds: Normal breath sounds.  Abdominal:     Palpations: Abdomen is soft.     Tenderness: There is no abdominal tenderness.  Musculoskeletal:        General: No swelling.     Cervical back: Neck supple.  Skin:    General: Skin is warm and dry.     Capillary Refill: Capillary refill takes less than 2 seconds.  Neurological:     Mental Status: She is alert.  Psychiatric:        Mood and Affect: Mood normal.     UC Treatments / Results  Labs (all labs ordered are listed, but only abnormal results are displayed) Labs Reviewed  COVID-19, FLU A+B NAA    EKG   Radiology No results found.  Procedures Procedures (including critical care time)  Medications Ordered in UC Medications - No data to display  Initial Impression / Assessment and Plan / UC Course  I have reviewed the triage vital signs and the nursing notes.  Pertinent labs & imaging results that were available during my care of the patient were reviewed by me and considered in my medical decision making (see chart for details).     URI, COVID/flu test pending.  Cough syrup prescribed. Supportive treatment discussed.  Return precautions discussed.  Final Clinical Impressions(s) / UC Diagnoses   Final diagnoses:  At increased risk of exposure to COVID-19 virus  Acute upper respiratory infection     Discharge Instructions      Take cough syrup as needed for cough Recommend Flonase and Mucinex Drink plenty of fluids Return  if symptoms become worse   ED Prescriptions     Medication Sig  Dispense Auth. Provider   promethazine-dextromethorphan (PROMETHAZINE-DM) 6.25-15 MG/5ML syrup Take 5 mLs by mouth 4 (four) times daily as needed for cough. 118 mL Ward, Lenise Arena, PA-C      PDMP not reviewed this encounter.   Ward, Lenise Arena, PA-C 04/04/21 1215

## 2021-04-04 NOTE — ED Triage Notes (Signed)
Cough, sneezing, headache, congestion since Tuesday.

## 2021-04-05 LAB — COVID-19, FLU A+B NAA
Influenza A, NAA: NOT DETECTED
Influenza B, NAA: NOT DETECTED
SARS-CoV-2, NAA: DETECTED — AB

## 2022-12-19 ENCOUNTER — Ambulatory Visit
Admission: EM | Admit: 2022-12-19 | Discharge: 2022-12-19 | Disposition: A | Discharge status: Home / Self Care | Payer: BC Managed Care – PPO | Attending: Physician Assistant | Admitting: Physician Assistant

## 2022-12-19 DIAGNOSIS — B9689 Other specified bacterial agents as the cause of diseases classified elsewhere: Secondary | ICD-10-CM | POA: Diagnosis not present

## 2022-12-19 DIAGNOSIS — H109 Unspecified conjunctivitis: Secondary | ICD-10-CM

## 2022-12-19 DIAGNOSIS — L309 Dermatitis, unspecified: Secondary | ICD-10-CM

## 2022-12-19 MED ORDER — CLOTRIMAZOLE-BETAMETHASONE 1-0.05 % EX CREA: TOPICAL_CREAM | CUTANEOUS | 0 refills | Status: DC

## 2022-12-19 MED ORDER — POLYMYXIN B-TRIMETHOPRIM 10000-0.1 UNIT/ML-% OP SOLN: 1.0000 [drp] | Freq: Four times a day (QID) | OPHTHALMIC | 0 refills | Status: DC

## 2022-12-19 NOTE — ED Provider Notes (Signed)
[facility]    CSN: [id number] Arrival date & time: 12/19/22  1335      History   Chief Complaint No chief complaint on file.   HPI Betty Maldonado is a 67 y.o. female.   Patient presents today with several concerns.  Her primary concern today is a weeklong history of bilateral eye drainage.  She reports that this is worse on the left but has spread to the right.  She denies any ocular injury.  Denies any exposure to chemicals or fine particulate matter.  She does wear glasses but does not wear contacts.  Denies any recent illness or additional symptoms including cough or congestion.  Denies any visual disturbance, photophobia, eye pain, fever, nausea, vomiting, pain with extraocular movements.  She has not tried any over-the-counter medication for symptom management.  She does have an ophthalmologist that she sees annually for an eye exam.  She denies any foreign body sensation.  In addition, she reports an irritated area of skin on left ear.  She reports symptoms began whenever she went for a perm and then worsened after she went back to the hair salon for a shampoo a few days later.  She reports that the skin has been very irritated and has noticed some associated itching and clear drainage.  She denies any additional new exposures including to plants, insects, animals, personal hygiene products.  She has applied Neosporin but this did not provide any relief of symptoms.  Denies any spread of rash or additional lesions.    Past Medical History:  Diagnosis Date   Depression    Mental disorder    depression    Patient Active Problem List   Diagnosis Date Noted   MDD (major depressive disorder), recurrent, in full remission (HCC) 06/30/2018   Special screening for malignant neoplasms, colon    Hyperlipidemia 12/19/2017   Fasting hyperglycemia 12/19/2017   Grief at loss of child 07/15/2017    Past Surgical History:  Procedure Laterality Date   ABDOMINAL  HYSTERECTOMY     COLONOSCOPY N/A 04/03/2018   Procedure: COLONOSCOPY;  Surgeon: West Bali, MD;  Location: [facility];  Service: Endoscopy;  Laterality: N/A;  8:30   POLYPECTOMY  04/03/2018   Procedure: POLYPECTOMY;  Surgeon: West Bali, MD;  Location: [facility];  Service: Endoscopy;;  Ascending,Hep.Flex,rectum    OB History     Gravida  1   Para  1   Term      Preterm      AB      Living  0      SAB      IAB      Ectopic      Multiple      Live Births               Home Medications    Prior to Admission medications   Medication Sig Start Date End Date Taking? Authorizing Provider  clotrimazole-betamethasone (LOTRISONE) cream Apply to affected area 2 times daily prn 12/19/22  Yes Bernita Beckstrom K, PA-C  trimethoprim-polymyxin b (POLYTRIM) ophthalmic solution Place 1 drop into both eyes every 6 (six) hours. 12/19/22  Yes Kendric Sindelar, Noberto Retort, PA-C    Family History Family History  Problem Relation Age of Onset   Heart attack Mother    Hypertension Mother    Hypertension Father    Dementia Father    Schizophrenia Other    Dementia Paternal Grandmother    Hypertension Brother  Hypertension Sister    Colon cancer Neg Hx    Colon polyps Neg Hx     Social History Social History   Tobacco Use   Smoking status: Former    Current packs/day: 0.00    Average packs/day: 0.5 packs/day for 10.0 years (5.0 ttl pk-yrs)    Types: Cigarettes    Start date: 02/07/2003    Quit date: 02/06/2013    Years since quitting: 9.8   Smokeless tobacco: Never  Vaping Use   Vaping status: Never Used  Substance Use Topics   Alcohol use: Not Currently    Comment: occ   Drug use: Never     Allergies   Patient has no known allergies.   Review of Systems Review of Systems  Constitutional:  Positive for activity change. Negative for appetite change, fatigue and fever.  HENT:  Negative for congestion.   Eyes:  Positive for discharge and redness. Negative for  photophobia, pain, itching and visual disturbance.  Respiratory:  Negative for cough.   Gastrointestinal:  Negative for abdominal pain, diarrhea, nausea and vomiting.  Skin:  Positive for rash.  Neurological:  Negative for dizziness, light-headedness and headaches.     Physical Exam Triage Vital Signs ED Triage Vitals  Encounter Vitals Group     BP 12/19/22 1350 (!) 143/77     Systolic BP Percentile --      Diastolic BP Percentile --      Pulse Rate 12/19/22 1350 94     Resp 12/19/22 1350 18     Temp 12/19/22 1350 98.4 F (36.9 C)     Temp Source 12/19/22 1350 Oral     SpO2 12/19/22 1350 93 %     Weight --      Height --      Head Circumference --      Peak Flow --      Pain Score 12/19/22 1352 0     Pain Loc --      Pain Education --      Exclude from Growth Chart --    No data found.  Updated Vital Signs BP (!) 143/77 (BP Location: Right Arm)   Pulse 94   Temp 98.4 F (36.9 C) (Oral)   Resp 18   SpO2 93%   Visual Acuity Right Eye Distance:   Left Eye Distance:   Bilateral Distance:    Right Eye Near:   Left Eye Near:    Bilateral Near:     Physical Exam Vitals reviewed.  Constitutional:      General: She is awake. She is not in acute distress.    Appearance: Normal appearance. She is well-developed. She is not ill-appearing.     Comments: Very pleasant female appears stated age in no acute distress sitting comfortably in exam room  HENT:     Head: Normocephalic and atraumatic.     Right Ear: Tympanic membrane, ear canal and external ear normal. Tympanic membrane is not erythematous or bulging.     Left Ear: Tympanic membrane, ear canal and external ear normal. Tympanic membrane is not erythematous or bulging.     Ears:     Comments: Left ear: Normal-appearing external auditory canal.    Mouth/Throat:     Pharynx: Uvula midline. No oropharyngeal exudate or posterior oropharyngeal erythema.  Eyes:     Extraocular Movements: Extraocular movements intact.      Conjunctiva/sclera:     Right eye: Right conjunctiva is injected. Exudate present. No chemosis.  Left eye: Left conjunctiva is injected. Exudate present. No chemosis.    Pupils: Pupils are equal, round, and reactive to light.     Comments: Purulent drainage noted medial and lateral canthus of the left eye and medial canthus of right eye with associated conjunctival injection.  No pain with extraocular movements.  Cardiovascular:     Rate and Rhythm: Normal rate and regular rhythm.     Heart sounds: Normal heart sounds, S1 normal and S2 normal. No murmur heard. Pulmonary:     Effort: Pulmonary effort is normal.     Breath sounds: Normal breath sounds. No wheezing, rhonchi or rales.     Comments: Clear to auscultation bilaterally Skin:    Findings: Rash present. Rash is macular and papular.     Comments: Maculopapular rash with some maceration noted edge of left pinna and helix.  Psychiatric:        Behavior: Behavior is cooperative.      UC Treatments / Results  Labs (all labs ordered are listed, but only abnormal results are displayed) Labs Reviewed - No data to display  EKG   Radiology No results found.  Procedures Procedures (including critical care time)  Medications Ordered in UC Medications - No data to display  Initial Impression / Assessment and Plan / UC Course  I have reviewed the triage vital signs and the nursing notes.  Pertinent labs & imaging results that were available during my care of the patient were reviewed by me and considered in my medical decision making (see chart for details).     Patient is well-appearing, afebrile, nontoxic, nontachycardic.  Concern for bacterial conjunctivitis given significant overnight drainage with purulent drainage on exam.  Patient denies any foreign body sensation or ocular trauma so fluorescein staining was deferred.  Will treat with Polytrim every 6 hours for the next week.  Discussed that she continue using warm  compresses for additional symptom relief as well as lubricating eyedrops.  Recommend that she follow-up with ophthalmologist if her symptoms or not improving within a few days.  If anything worsens or changes and she has eye pain, visual disturbance, fever, nausea, vomiting, photophobia she needs to be seen immediately.  Rash appears consistent with contact dermatitis though with associated maceration and worsening symptoms will cover for fungal component.  She was encouraged to keep area clean with soap and water and avoid using Neosporin as this could be contributing to ongoing symptoms.  Will treat with Lotrisone twice daily for up to 2 weeks.  Discussed that if the rash changes or worsens in any way she needs to be seen immediately.  Recommend close follow-up with her primary care.  Final Clinical Impressions(s) / UC Diagnoses   Final diagnoses:  Bacterial conjunctivitis of both eyes  Dermatitis of external ear     Discharge Instructions      We are treating you for a bacterial infection of the eyes.  Apply drops every 6 hours while awake for the next week.  Do not touch tip of medication bottle to the eye and make sure to wash your hands prior to handling medication to prevent contamination.  Continue with warm compresses to keep your eyes clean.  If your symptoms are not improving within a few days please follow-up with your eye doctor.  If anything worsens and you have eye pain, vision change, fever, nausea, vomiting you should be seen immediately.  I suspect the rash is from irritation from the chemicals used during your hair appointment.  Keep the area clean with soap and water and make sure that it stays dry.  Stop the Neosporin.  Apply Lotrisone twice daily for up to 2 weeks.  If this is not improving within a few days or if anything worsens please return for reevaluation.     ED Prescriptions     Medication Sig Dispense Auth. Provider   trimethoprim-polymyxin b (POLYTRIM)  ophthalmic solution Place 1 drop into both eyes every 6 (six) hours. 10 mL Orlin Kann K, PA-C   clotrimazole-betamethasone (LOTRISONE) cream Apply to affected area 2 times daily prn 15 g Calie Buttrey K, PA-C      PDMP not reviewed this encounter.   Jeani Hawking, PA-C 12/19/22 1433

## 2022-12-19 NOTE — ED Triage Notes (Signed)
Pt reports her eyes  (maily left) are itchy, red, and crusty substance on them in the morning x 1 week. Also states that after receiving a perm on August 16th her left ear has been irritated, draining, and draining.

## 2022-12-19 NOTE — Discharge Instructions (Signed)
We are treating you for a bacterial infection of the eyes.  Apply drops every 6 hours while awake for the next week.  Do not touch tip of medication bottle to the eye and make sure to wash your hands prior to handling medication to prevent contamination.  Continue with warm compresses to keep your eyes clean.  If your symptoms are not improving within a few days please follow-up with your eye doctor.  If anything worsens and you have eye pain, vision change, fever, nausea, vomiting you should be seen immediately.  I suspect the rash is from irritation from the chemicals used during your hair appointment.  Keep the area clean with soap and water and make sure that it stays dry.  Stop the Neosporin.  Apply Lotrisone twice daily for up to 2 weeks.  If this is not improving within a few days or if anything worsens please return for reevaluation.

## 2023-06-20 ENCOUNTER — Ambulatory Visit: Payer: BC Managed Care – PPO | Admitting: Physician Assistant

## 2023-08-02 ENCOUNTER — Other Ambulatory Visit (HOSPITAL_COMMUNITY): Payer: Self-pay | Admitting: Family Medicine

## 2023-08-02 DIAGNOSIS — Z1231 Encounter for screening mammogram for malignant neoplasm of breast: Secondary | ICD-10-CM

## 2023-08-03 ENCOUNTER — Ambulatory Visit (HOSPITAL_COMMUNITY)
Admission: RE | Admit: 2023-08-03 | Discharge: 2023-08-03 | Disposition: A | Discharge status: Home / Self Care | Source: Ambulatory Visit

## 2023-08-03 DIAGNOSIS — Z1231 Encounter for screening mammogram for malignant neoplasm of breast: Secondary | ICD-10-CM | POA: Insufficient documentation

## 2023-08-03 DIAGNOSIS — R928 Other abnormal and inconclusive findings on diagnostic imaging of breast: Secondary | ICD-10-CM | POA: Diagnosis not present

## 2023-08-08 ENCOUNTER — Other Ambulatory Visit (HOSPITAL_COMMUNITY): Payer: Self-pay | Admitting: Family Medicine

## 2023-08-08 DIAGNOSIS — R928 Other abnormal and inconclusive findings on diagnostic imaging of breast: Secondary | ICD-10-CM

## 2023-08-16 ENCOUNTER — Telehealth: Payer: Self-pay | Admitting: Family Medicine

## 2023-08-16 NOTE — Telephone Encounter (Signed)
 Patient is scheduled 08/25/2023 for further imaging

## 2023-08-18 ENCOUNTER — Encounter (HOSPITAL_COMMUNITY): Payer: Self-pay

## 2023-08-25 ENCOUNTER — Ambulatory Visit (HOSPITAL_COMMUNITY)
Admission: RE | Admit: 2023-08-25 | Discharge: 2023-08-25 | Disposition: A | Discharge status: Home / Self Care | Source: Ambulatory Visit | Attending: Family Medicine | Admitting: Family Medicine

## 2023-08-25 ENCOUNTER — Encounter (HOSPITAL_COMMUNITY): Payer: Self-pay

## 2023-08-25 DIAGNOSIS — R928 Other abnormal and inconclusive findings on diagnostic imaging of breast: Secondary | ICD-10-CM | POA: Diagnosis not present

## 2023-08-25 DIAGNOSIS — N6001 Solitary cyst of right breast: Secondary | ICD-10-CM | POA: Diagnosis not present

## 2023-08-25 DIAGNOSIS — N6310 Unspecified lump in the right breast, unspecified quadrant: Secondary | ICD-10-CM | POA: Diagnosis not present

## 2023-08-25 DIAGNOSIS — R92321 Mammographic fibroglandular density, right breast: Secondary | ICD-10-CM | POA: Diagnosis not present

## 2023-09-15 ENCOUNTER — Ambulatory Visit: Admitting: Physician Assistant

## 2023-10-19 ENCOUNTER — Encounter: Payer: Self-pay | Admitting: Physician Assistant

## 2023-10-19 ENCOUNTER — Ambulatory Visit: Admitting: Physician Assistant

## 2023-10-19 VITALS — BP 130/78 | HR 78 | Temp 97.5°F | Ht 65.0 in | Wt 257.0 lb

## 2023-10-19 DIAGNOSIS — E782 Mixed hyperlipidemia: Secondary | ICD-10-CM

## 2023-10-19 DIAGNOSIS — K219 Gastro-esophageal reflux disease without esophagitis: Secondary | ICD-10-CM | POA: Diagnosis not present

## 2023-10-19 DIAGNOSIS — Z7689 Persons encountering health services in other specified circumstances: Secondary | ICD-10-CM

## 2023-10-19 DIAGNOSIS — R7309 Other abnormal glucose: Secondary | ICD-10-CM

## 2023-10-19 MED ORDER — SUCRALFATE 1 G PO TABS: 1.0000 g | ORAL_TABLET | Freq: Three times a day (TID) | ORAL | 1 refills | Status: AC | PRN

## 2023-10-19 MED ORDER — PANTOPRAZOLE SODIUM 20 MG PO TBEC: 20.0000 mg | DELAYED_RELEASE_TABLET | Freq: Every day | ORAL | 2 refills | Status: DC

## 2023-10-19 NOTE — Assessment & Plan Note (Signed)
 Stable. Continue with current management.  Discussed healthy diet and lifestyle. Lipid panel today.

## 2023-10-19 NOTE — Progress Notes (Signed)
 New Patient Office Visit  Subjective    Patient ID: Betty Maldonado, female    DOB: October 30, 1955  Age: 68 y.o. MRN: [id number]  CC:  Chief Complaint  Patient presents with   New Patient (Initial Visit)    Here to reestablish care Upper back pain- not hurting currently GERD, hot flashes and facial hair growth    HPI Betty Maldonado presents to establish care  Patient presents today with past medical history significant for hyperlipidemia. She does not take any medications daily at this time. She reports concerns for acid reflux. She relates history of GERD, however has never taken medication for symptoms. She reports increased in heartburn and indigestion recently. Reports symptoms almost daily, worse in the evenings. Denies abdominal pain, bloating, nausea, vomiting, or blood in stool. She relates being up to date on mammogram and colon cancer screening.   Outpatient Encounter Medications as of 10/19/2023  Medication Sig   pantoprazole  (PROTONIX ) 20 MG tablet Take 1 tablet (20 mg total) by mouth daily.   sucralfate  (CARAFATE ) 1 g tablet Take 1 tablet (1 g total) by mouth 3 (three) times daily as needed.   [DISCONTINUED] clotrimazole -betamethasone  (LOTRISONE ) cream Apply to affected area 2 times daily prn   [DISCONTINUED] trimethoprim -polymyxin b  (POLYTRIM ) ophthalmic solution Place 1 drop into both eyes every 6 (six) hours.   No facility-administered encounter medications on file as of 10/19/2023.    Past Medical History:  Diagnosis Date   Depression    Mental disorder    depression    Past Surgical History:  Procedure Laterality Date   ABDOMINAL HYSTERECTOMY     COLONOSCOPY N/A 04/03/2018   Procedure: COLONOSCOPY;  Surgeon: Harvey Margo CROME, MD;  Location: [facility];  Service: Endoscopy;  Laterality: N/A;  8:30   POLYPECTOMY  04/03/2018   Procedure: POLYPECTOMY;  Surgeon: Harvey Margo CROME, MD;  Location: [facility];  Service: Endoscopy;;   Ascending,Hep.Flex,rectum    Family History  Problem Relation Age of Onset   Heart attack Mother    Hypertension Mother    Kidney disease Mother    Heart failure Mother    Hypertension Father    Dementia Father    Hypertension Sister    Hypertension Brother    Dementia Paternal Grandmother    Schizophrenia Other    Colon cancer Neg Hx    Colon polyps Neg Hx     Social History   Socioeconomic History   Marital status: Married    Spouse name: Not on file   Number of children: Not on file   Years of education: Not on file   Highest education level: Not on file  Occupational History   Not on file  Tobacco Use   Smoking status: Former    Current packs/day: 0.00    Average packs/day: 0.5 packs/day for 10.0 years (5.0 ttl pk-yrs)    Types: Cigarettes    Start date: 02/07/2003    Quit date: 02/06/2013    Years since quitting: 10.7   Smokeless tobacco: Never  Vaping Use   Vaping status: Never Used  Substance and Sexual Activity   Alcohol use: Not Currently    Comment: occ   Drug use: Never   Sexual activity: Yes    Birth control/protection: Surgical    Comment: hyst   Other Topics Concern   Not on file  Social History Narrative   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 HOBBIES: TRAVEL, WATCHING MOVIES.   Social  Drivers of Corporate investment banker Strain: Not on file  Food Insecurity: Not on file  Transportation Needs: Not on file  Physical Activity: Not on file  Stress: Not on file  Social Connections: Not on file  Intimate Partner Violence: Not on file    Review of Systems  Constitutional:  Negative for chills, fever and malaise/fatigue.  Eyes:  Negative for blurred vision and double vision.  Respiratory:  Negative for cough and shortness of breath.   Cardiovascular:  Negative for chest pain and palpitations.  Gastrointestinal:  Positive for heartburn. Negative for abdominal pain, blood in stool, constipation, diarrhea, nausea and vomiting.   Musculoskeletal:  Negative for joint pain and myalgias.  Neurological:  Negative for dizziness and headaches.  Psychiatric/Behavioral:  Negative for depression. The patient is not nervous/anxious.         Objective    BP 130/78   Pulse 78   Temp (!) 97.5 F (36.4 C)   Ht 5' 5 (1.651 m)   Wt 257 lb (116.6 kg)   SpO2 95%   BMI 42.77 kg/m   Physical Exam Constitutional:      Appearance: Normal appearance.  HENT:     Head: Normocephalic.     Mouth/Throat:     Mouth: Mucous membranes are moist.     Pharynx: Oropharynx is clear.  Eyes:     Extraocular Movements: Extraocular movements intact.     Conjunctiva/sclera: Conjunctivae normal.  Cardiovascular:     Rate and Rhythm: Normal rate and regular rhythm.     Heart sounds: Normal heart sounds. No murmur heard. Pulmonary:     Effort: Pulmonary effort is normal.     Breath sounds: Normal breath sounds. No wheezing or rales.  Abdominal:     General: Abdomen is flat. Bowel sounds are normal. There is no distension.     Palpations: Abdomen is soft.     Tenderness: There is no abdominal tenderness.  Musculoskeletal:     Right lower leg: No edema.     Left lower leg: No edema.  Skin:    General: Skin is warm and dry.  Neurological:     General: No focal deficit present.     Mental Status: She is alert and oriented to person, place, and time.  Psychiatric:        Mood and Affect: Mood normal.        Behavior: Behavior normal.        Assessment & Plan:  Encounter to establish care  Gastroesophageal reflux disease without esophagitis Assessment & Plan: Patient presents today with history of acid reflux. Overall reassuring exam, abdomen is soft and non tender, normal bowel sounds, no epigastric pain. Discussed dietary modifications and avoiding meals 2-3 hours before bed. Will start Protonix  daily and Carafate  as needed for symptom control. Patient advised to take Protonix  in the am on an empty stomach. Warning signs  and follow up precautions discussed. Patient to follow up in approximately 5 months for annual physical.   Orders: -     Pantoprazole  Sodium; Take 1 tablet (20 mg total) by mouth daily.  Dispense: 30 tablet; Refill: 2 -     Sucralfate ; Take 1 tablet (1 g total) by mouth 3 (three) times daily as needed.  Dispense: 30 tablet; Refill: 1  Mixed hyperlipidemia Assessment & Plan: Stable. Continue with current management.  Discussed healthy diet and lifestyle. Lipid panel today.   Orders: -     Lipid panel -  CBC with Differential/Platelet  Elevated glucose level -     CMP14+EGFR -     CBC with Differential/Platelet    Return in about 5 months (around 03/20/2024) for phys .   Charmaine Saskia Simerson, PA-C

## 2023-10-19 NOTE — Assessment & Plan Note (Addendum)
 Patient presents today with history of acid reflux. Overall reassuring exam, abdomen is soft and non tender, normal bowel sounds, no epigastric pain. Discussed dietary modifications and avoiding meals 2-3 hours before bed. Will start Protonix  daily and Carafate  as needed for symptom control. Patient advised to take Protonix  in the am on an empty stomach. Warning signs and follow up precautions discussed. Patient to follow up in approximately 5 months for annual physical.

## 2023-10-20 ENCOUNTER — Ambulatory Visit: Payer: Self-pay | Admitting: Physician Assistant

## 2023-10-20 LAB — CBC WITH DIFFERENTIAL/PLATELET
Basophils Absolute: 0 x10E3/uL (ref 0.0–0.2)
Basos: 1 %
EOS (ABSOLUTE): 0.1 x10E3/uL (ref 0.0–0.4)
Eos: 2 %
Hematocrit: 40.5 % (ref 34.0–46.6)
Hemoglobin: 13.1 g/dL (ref 11.1–15.9)
Immature Grans (Abs): 0 x10E3/uL (ref 0.0–0.1)
Immature Granulocytes: 0 %
Lymphocytes Absolute: 2.9 x10E3/uL (ref 0.7–3.1)
Lymphs: 36 %
MCH: 29 pg (ref 26.6–33.0)
MCHC: 32.3 g/dL (ref 31.5–35.7)
MCV: 90 fL (ref 79–97)
Monocytes Absolute: 0.5 x10E3/uL (ref 0.1–0.9)
Monocytes: 6 %
Neutrophils Absolute: 4.5 x10E3/uL (ref 1.4–7.0)
Neutrophils: 55 %
Platelets: 348 x10E3/uL (ref 150–450)
RBC: 4.51 x10E6/uL (ref 3.77–5.28)
RDW: 13.8 % (ref 11.7–15.4)
WBC: 8.1 x10E3/uL (ref 3.4–10.8)

## 2023-10-20 LAB — CMP14+EGFR
ALT: 15 IU/L (ref 0–32)
AST: 17 IU/L (ref 0–40)
Albumin: 3.9 g/dL (ref 3.9–4.9)
Alkaline Phosphatase: 76 IU/L (ref 44–121)
BUN/Creatinine Ratio: 13 (ref 12–28)
BUN: 11 mg/dL (ref 8–27)
Bilirubin Total: 0.2 mg/dL (ref 0.0–1.2)
CO2: 21 mmol/L (ref 20–29)
Calcium: 9.4 mg/dL (ref 8.7–10.3)
Chloride: 102 mmol/L (ref 96–106)
Creatinine, Ser: 0.87 mg/dL (ref 0.57–1.00)
Globulin, Total: 2.8 g/dL (ref 1.5–4.5)
Glucose: 140 mg/dL — ABNORMAL HIGH (ref 70–99)
Potassium: 4.3 mmol/L (ref 3.5–5.2)
Sodium: 139 mmol/L (ref 134–144)
Total Protein: 6.7 g/dL (ref 6.0–8.5)
eGFR: 74 mL/min/1.73 (ref >59)

## 2023-10-20 LAB — LIPID PANEL
Chol/HDL Ratio: 4.3 ratio (ref 0.0–4.4)
Cholesterol, Total: 193 mg/dL (ref 100–199)
HDL: 45 mg/dL (ref >39)
LDL Chol Calc (NIH): 127 mg/dL — ABNORMAL HIGH (ref 0–99)
Triglycerides: 114 mg/dL (ref 0–149)
VLDL Cholesterol Cal: 21 mg/dL (ref 5–40)

## 2023-11-18 ENCOUNTER — Other Ambulatory Visit: Payer: Self-pay

## 2023-11-18 ENCOUNTER — Telehealth: Payer: Self-pay | Admitting: Physician Assistant

## 2023-11-18 DIAGNOSIS — K219 Gastro-esophageal reflux disease without esophagitis: Secondary | ICD-10-CM

## 2023-11-18 MED ORDER — PANTOPRAZOLE SODIUM 20 MG PO TBEC: 20.0000 mg | DELAYED_RELEASE_TABLET | Freq: Every day | ORAL | 2 refills | Status: DC

## 2023-11-18 NOTE — Telephone Encounter (Unsigned)
 Copied from CRM [id number]. Topic: Clinical - Medication Refill >> Nov 18, 2023  1:57 PM Tiffini S wrote: Medication: pantoprazole  (PROTONIX ) 20 MG tablet  Has the patient contacted their pharmacy? No (Agent: If no, request that the patient contact the pharmacy for the refill. If patient does not wish to contact the pharmacy document the reason why and proceed with request.) (Agent: If yes, when and what did the pharmacy advise?)  This is the patient's preferred pharmacy:   [facility] Phone: [phone]   Is this the correct pharmacy for this prescription? Yes If no, delete pharmacy and type the correct one.   Has the prescription been filled recently? Yes  Is the patient out of the medication? Yes, have about 4 tablets left   Has the patient been seen for an appointment in the last year OR does the patient have an upcoming appointment? Yes  Can we respond through MyChart? No, please call patient at [phone]  Agent: Please be advised that Rx refills may take up to 3 business days. We ask that you follow-up with your pharmacy.

## 2024-01-07 ENCOUNTER — Other Ambulatory Visit: Payer: Self-pay | Admitting: Physician Assistant

## 2024-01-07 DIAGNOSIS — K219 Gastro-esophageal reflux disease without esophagitis: Secondary | ICD-10-CM

## 2024-03-20 ENCOUNTER — Encounter: Admitting: Physician Assistant

## 2024-03-27 ENCOUNTER — Ambulatory Visit: Admitting: Physician Assistant

## 2024-03-27 ENCOUNTER — Ambulatory Visit (HOSPITAL_COMMUNITY)
Admission: RE | Admit: 2024-03-27 | Discharge: 2024-03-27 | Disposition: A | Source: Ambulatory Visit | Attending: Physician Assistant

## 2024-03-27 ENCOUNTER — Encounter: Payer: Self-pay | Admitting: Physician Assistant

## 2024-03-27 VITALS — BP 138/82 | HR 77 | Temp 96.8°F | Ht 65.0 in | Wt 269.1 lb

## 2024-03-27 DIAGNOSIS — Z6841 Body Mass Index (BMI) 40.0 and over, adult: Secondary | ICD-10-CM | POA: Diagnosis not present

## 2024-03-27 DIAGNOSIS — K219 Gastro-esophageal reflux disease without esophagitis: Secondary | ICD-10-CM

## 2024-03-27 DIAGNOSIS — E7800 Pure hypercholesterolemia, unspecified: Secondary | ICD-10-CM

## 2024-03-27 DIAGNOSIS — Z131 Encounter for screening for diabetes mellitus: Secondary | ICD-10-CM | POA: Diagnosis not present

## 2024-03-27 DIAGNOSIS — R0609 Other forms of dyspnea: Secondary | ICD-10-CM | POA: Insufficient documentation

## 2024-03-27 NOTE — Assessment & Plan Note (Signed)
 Stable. Symptoms well controlled with current management. Continue Protonix  daily.

## 2024-03-27 NOTE — Assessment & Plan Note (Addendum)
 New onset shortness of breath with exertion. EKG, chest x-ray, and BNP to evaluate for cardiac etiology. Physical exam reassuring, no murmurs or abnormal heart sounds, lungs clear to auscultation bilaterally. Denies leg swelling orthopnea, or chest pain.  EKG within normal limits, normal sinus rhythm with 72 bmp, PR interval- 168 ms, and QT- 430 ms.  - Lab work today to include CBC, thyroid  studies, electrolytes, and BNP.  - Reviewed patient's EKG.  - Chest x-ray ordered today.  - Follow up pending imaging and lab results, discussed potential referral to cardiology for stress test.

## 2024-03-27 NOTE — Assessment & Plan Note (Signed)
 Weight gain of 10 pounds over six months, currently 269 pounds. Discussed impact on dyspnea. - Advised contacting insurance for weight loss medication coverage. - Follow-up in six months to reassess weight and dyspnea, sooner for worsening symptoms.

## 2024-03-27 NOTE — Progress Notes (Signed)
 Established Patient Office Visit  Subjective   Patient ID: Betty Maldonado, female    DOB: 1956-03-15  Age: 68 y.o. MRN: [id number]  Chief Complaint  Patient presents with   Follow-up    Patient is here for a 5 month follow up visit  Patient is having shortness of breath and feels like its pertaining to weight gain. Wanting weight loss advice.    Discussed the use of AI scribe software for clinical note transcription with the patient, who gave verbal consent to proceed.  History of Present Illness Betty Maldonado is a 68 year old female who presents with shortness of breath during physical activities.  For the past couple of months she has had shortness of breath with activities such as sweeping, lifting, and carrying. Symptoms resolve with rest and do not occur when sitting or at rest. She denies chest pain, recent cough or viral illness, new leg swelling other than when traveling, new headaches or vision changes. She notes a 10 lb weight gain over the past six months, with current weight 269 lb. Comorbidities include hyperlipidemia and former smoking status.   She has acid reflux treated with daily Protonix  and intermittent Carafate , which she has needed only once recently. Reports symptoms are well controlled with current regimen.   She is interested in weight loss and inquires about Zepbound. Additionally, she requests updated blood work including A1c.     Review of Systems  Constitutional:  Negative for activity change, appetite change, fatigue and fever.  Eyes:  Negative for visual disturbance.  Respiratory:  Positive for shortness of breath. Negative for chest tightness and wheezing.   Cardiovascular:  Negative for chest pain, palpitations and leg swelling.  Neurological:  Negative for dizziness, weakness and headaches.       Objective:     BP 138/82   Pulse 77   Temp (!) 96.8 F (36 C)   Ht 5' 5 (1.651 m)   Wt 269 lb 2 oz (122.1 kg)   SpO2 96%    BMI 44.78 kg/m    Physical Exam Constitutional:      Appearance: Normal appearance. She is obese. She is not ill-appearing.  HENT:     Head: Normocephalic and atraumatic.     Mouth/Throat:     Mouth: Mucous membranes are moist.     Pharynx: Oropharynx is clear.  Eyes:     Extraocular Movements: Extraocular movements intact.     Conjunctiva/sclera: Conjunctivae normal.  Cardiovascular:     Rate and Rhythm: Normal rate and regular rhythm.     Heart sounds: Normal heart sounds. No murmur heard. Pulmonary:     Effort: Pulmonary effort is normal.     Breath sounds: Normal breath sounds. No wheezing, rhonchi or rales.  Musculoskeletal:     Right lower leg: No edema.     Left lower leg: No edema.  Skin:    General: Skin is warm and dry.  Neurological:     General: No focal deficit present.     Mental Status: She is alert and oriented to person, place, and time.  Psychiatric:        Mood and Affect: Mood normal.        Behavior: Behavior normal.     No results found for any visits on 03/27/24.  The 10-year ASCVD risk score (Arnett DK, et al., 2019) is: 9.6%    Assessment & Plan:   Return in about 6 months (around 09/25/2024).   DOE (dyspnea  on exertion) Assessment & Plan: New onset shortness of breath with exertion. EKG, chest x-ray, and BNP to evaluate for cardiac etiology. Physical exam reassuring, no murmurs or abnormal heart sounds, lungs clear to auscultation bilaterally. Denies leg swelling orthopnea, or chest pain.  EKG within normal limits, normal sinus rhythm with 72 bmp, PR interval- 168 ms, and QT- 430 ms.  - Lab work today to include CBC, thyroid  studies, electrolytes, and BNP.  - Reviewed patient's EKG.  - Chest x-ray ordered today.  - Follow up pending imaging and lab results, discussed potential referral to cardiology for stress test.   Orders: -     CBC with Differential/Platelet -     Comprehensive metabolic panel with GFR -     Brain natriuretic  peptide -     TSH + free T4 -     DG Chest 2 View -     EKG 12-Lead -     Routine electrocardiogram (ECG) using at least 12 leads with interpretation and report  Gastroesophageal reflux disease without esophagitis Assessment & Plan: Stable. Symptoms well controlled with current management. Continue Protonix  daily.    Obesity, morbid (HCC) Assessment & Plan: Weight gain of 10 pounds over six months, currently 269 pounds. Discussed impact on dyspnea. - Advised contacting insurance for weight loss medication coverage. - Follow-up in six months to reassess weight and dyspnea, sooner for worsening symptoms.    Orders: -     Hemoglobin A1c  Pure hypercholesterolemia -     Lipid panel  Encounter for screening examination for impaired glucose regulation and diabetes mellitus -     Hemoglobin A1c   Betty [place], PA-C

## 2024-03-29 ENCOUNTER — Ambulatory Visit: Payer: Self-pay | Admitting: Physician Assistant

## 2024-03-29 DIAGNOSIS — E1165 Type 2 diabetes mellitus with hyperglycemia: Secondary | ICD-10-CM

## 2024-03-29 LAB — CBC WITH DIFFERENTIAL/PLATELET
Basophils Absolute: 0.1 x10E3/uL (ref 0.0–0.2)
Basos: 1 %
EOS (ABSOLUTE): 0.1 x10E3/uL (ref 0.0–0.4)
Eos: 2 %
Hematocrit: 39.3 % (ref 34.0–46.6)
Hemoglobin: 13 g/dL (ref 11.1–15.9)
Immature Grans (Abs): 0 x10E3/uL (ref 0.0–0.1)
Immature Granulocytes: 0 %
Lymphocytes Absolute: 3 x10E3/uL (ref 0.7–3.1)
Lymphs: 35 %
MCH: 28.6 pg (ref 26.6–33.0)
MCHC: 33.1 g/dL (ref 31.5–35.7)
MCV: 86 fL (ref 79–97)
Monocytes Absolute: 0.6 x10E3/uL (ref 0.1–0.9)
Monocytes: 7 %
Neutrophils Absolute: 4.9 x10E3/uL (ref 1.4–7.0)
Neutrophils: 55 %
Platelets: 366 x10E3/uL (ref 150–450)
RBC: 4.55 x10E6/uL (ref 3.77–5.28)
RDW: 13.7 % (ref 11.7–15.4)
WBC: 8.7 x10E3/uL (ref 3.4–10.8)

## 2024-03-29 LAB — COMPREHENSIVE METABOLIC PANEL WITH GFR
ALT: 14 IU/L (ref 0–32)
AST: 15 IU/L (ref 0–40)
Albumin: 3.8 g/dL — ABNORMAL LOW (ref 3.9–4.9)
Alkaline Phosphatase: 77 IU/L (ref 49–135)
BUN/Creatinine Ratio: 15 (ref 12–28)
BUN: 12 mg/dL (ref 8–27)
Bilirubin Total: 0.2 mg/dL (ref 0.0–1.2)
CO2: 25 mmol/L (ref 20–29)
Calcium: 9 mg/dL (ref 8.7–10.3)
Chloride: 101 mmol/L (ref 96–106)
Creatinine, Ser: 0.79 mg/dL (ref 0.57–1.00)
Globulin, Total: 2.7 g/dL (ref 1.5–4.5)
Glucose: 131 mg/dL — ABNORMAL HIGH (ref 70–99)
Potassium: 4.5 mmol/L (ref 3.5–5.2)
Sodium: 139 mmol/L (ref 134–144)
Total Protein: 6.5 g/dL (ref 6.0–8.5)
eGFR: 83 mL/min/1.73 (ref >59)

## 2024-03-29 LAB — TSH+FREE T4
Free T4: 1.06 ng/dL (ref 0.82–1.77)
TSH: 0.946 u[IU]/mL (ref 0.450–4.500)

## 2024-03-29 LAB — LIPID PANEL
Chol/HDL Ratio: 4.4 ratio (ref 0.0–4.4)
Cholesterol, Total: 173 mg/dL (ref 100–199)
HDL: 39 mg/dL — ABNORMAL LOW (ref >39)
LDL Chol Calc (NIH): 112 mg/dL — ABNORMAL HIGH (ref 0–99)
Triglycerides: 121 mg/dL (ref 0–149)
VLDL Cholesterol Cal: 22 mg/dL (ref 5–40)

## 2024-03-29 LAB — HEMOGLOBIN A1C
Est. average glucose Bld gHb Est-mCnc: 174 mg/dL
Hgb A1c MFr Bld: 7.7 % — ABNORMAL HIGH (ref 4.8–5.6)

## 2024-03-29 LAB — BRAIN NATRIURETIC PEPTIDE: BNP: 8.4 pg/mL (ref 0.0–100.0)

## 2024-03-29 MED ORDER — METFORMIN HCL 500 MG PO TABS: 500.0000 mg | ORAL_TABLET | Freq: Two times a day (BID) | ORAL | 3 refills | Status: DC

## 2024-04-02 ENCOUNTER — Other Ambulatory Visit: Payer: Self-pay

## 2024-04-02 DIAGNOSIS — R0600 Dyspnea, unspecified: Secondary | ICD-10-CM | POA: Diagnosis not present

## 2024-04-02 DIAGNOSIS — R0689 Other abnormalities of breathing: Secondary | ICD-10-CM | POA: Diagnosis not present

## 2024-05-04 ENCOUNTER — Telehealth: Payer: Self-pay | Admitting: Physician Assistant

## 2024-05-04 DIAGNOSIS — E1165 Type 2 diabetes mellitus with hyperglycemia: Secondary | ICD-10-CM

## 2024-05-04 MED ORDER — METFORMIN HCL 500 MG PO TABS: 500.0000 mg | ORAL_TABLET | Freq: Two times a day (BID) | ORAL | 3 refills | Status: AC

## 2024-05-04 NOTE — Telephone Encounter (Signed)
 Refill has been sent in.

## 2024-05-04 NOTE — Telephone Encounter (Signed)
 Refill  metFORMIN  (GLUCOPHAGE ) 500 MG tablet   [facility]

## 2024-07-02 ENCOUNTER — Ambulatory Visit: Admitting: Physician Assistant

## 2024-09-25 ENCOUNTER — Ambulatory Visit: Admitting: Physician Assistant

## 2024-09-25 ENCOUNTER — Ambulatory Visit: Admitting: Family Medicine
